# Patient Record
Sex: Male | Born: 1990 | Hispanic: Yes | Marital: Single | Smoking: Never smoker
Health system: Southern US, Community
[De-identification: ages and names within clinical notes are randomized; demographics above are authoritative.]

## PROBLEM LIST (undated history)

## (undated) ENCOUNTER — Ambulatory Visit (HOSPITAL_COMMUNITY): Payer: MEDICAID

## (undated) HISTORY — PX: APPENDECTOMY: SHX54

---

## 2017-12-06 ENCOUNTER — Emergency Department (HOSPITAL_COMMUNITY): Payer: Self-pay

## 2017-12-06 ENCOUNTER — Encounter (HOSPITAL_COMMUNITY): Payer: Self-pay | Admitting: Emergency Medicine

## 2017-12-06 ENCOUNTER — Emergency Department (HOSPITAL_COMMUNITY)
Admission: EM | Admit: 2017-12-06 | Discharge: 2017-12-06 | Disposition: A | Discharge status: Home / Self Care | Payer: Self-pay | Attending: Emergency Medicine | Admitting: Emergency Medicine

## 2017-12-06 ENCOUNTER — Other Ambulatory Visit: Payer: Self-pay

## 2017-12-06 DIAGNOSIS — Y9301 Activity, walking, marching and hiking: Secondary | ICD-10-CM | POA: Insufficient documentation

## 2017-12-06 DIAGNOSIS — Y999 Unspecified external cause status: Secondary | ICD-10-CM | POA: Insufficient documentation

## 2017-12-06 DIAGNOSIS — S2241XA Multiple fractures of ribs, right side, initial encounter for closed fracture: Secondary | ICD-10-CM | POA: Insufficient documentation

## 2017-12-06 DIAGNOSIS — Y929 Unspecified place or not applicable: Secondary | ICD-10-CM | POA: Insufficient documentation

## 2017-12-06 DIAGNOSIS — W0110XA Fall on same level from slipping, tripping and stumbling with subsequent striking against unspecified object, initial encounter: Secondary | ICD-10-CM | POA: Insufficient documentation

## 2017-12-06 LAB — COMPREHENSIVE METABOLIC PANEL
ALK PHOS: 101 U/L (ref 38–126)
ALT: 30 U/L (ref 17–63)
ANION GAP: 12 (ref 5–15)
AST: 32 U/L (ref 15–41)
Albumin: 4.1 g/dL (ref 3.5–5.0)
BUN: 13 mg/dL (ref 6–20)
CALCIUM: 9.4 mg/dL (ref 8.9–10.3)
CO2: 24 mmol/L (ref 22–32)
CREATININE: 0.81 mg/dL (ref 0.61–1.24)
Chloride: 104 mmol/L (ref 101–111)
Glucose, Bld: 137 mg/dL — ABNORMAL HIGH (ref 65–99)
Potassium: 3.7 mmol/L (ref 3.5–5.1)
Sodium: 140 mmol/L (ref 135–145)
Total Bilirubin: 0.8 mg/dL (ref 0.3–1.2)
Total Protein: 6.8 g/dL (ref 6.5–8.1)

## 2017-12-06 LAB — URINALYSIS, ROUTINE W REFLEX MICROSCOPIC
Bilirubin Urine: NEGATIVE
GLUCOSE, UA: NEGATIVE mg/dL
Hgb urine dipstick: NEGATIVE
Ketones, ur: NEGATIVE mg/dL
LEUKOCYTES UA: NEGATIVE
Nitrite: NEGATIVE
PH: 5 (ref 5.0–8.0)
Protein, ur: NEGATIVE mg/dL
Specific Gravity, Urine: 1.021 (ref 1.005–1.030)

## 2017-12-06 LAB — CBC
HEMATOCRIT: 49.8 % (ref 39.0–52.0)
Hemoglobin: 16.6 g/dL (ref 13.0–17.0)
MCH: 28.3 pg (ref 26.0–34.0)
MCHC: 33.3 g/dL (ref 30.0–36.0)
MCV: 84.8 fL (ref 78.0–100.0)
Platelets: 244 10*3/uL (ref 150–400)
RBC: 5.87 MIL/uL — ABNORMAL HIGH (ref 4.22–5.81)
RDW: 12.8 % (ref 11.5–15.5)
WBC: 9.6 10*3/uL (ref 4.0–10.5)

## 2017-12-06 LAB — LIPASE, BLOOD: Lipase: 25 U/L (ref 11–51)

## 2017-12-06 MED ORDER — ACETAMINOPHEN 500 MG PO TABS: 500.0000 mg | ORAL_TABLET | Freq: Four times a day (QID) | ORAL | 0 refills | Status: DC | PRN

## 2017-12-06 MED ORDER — IBUPROFEN 600 MG PO TABS: 600.0000 mg | ORAL_TABLET | Freq: Four times a day (QID) | ORAL | 0 refills | Status: DC | PRN

## 2017-12-06 NOTE — ED Triage Notes (Signed)
Wife/translator stated, he has stomach pain and back pasin for a week.

## 2017-12-06 NOTE — ED Triage Notes (Signed)
PT instructed on use of IS via Interp

## 2017-12-06 NOTE — Discharge Instructions (Signed)
Alternate 600 mg of ibuprofen and [phone] mg of Tylenol every 3 hours as needed for pain for the next 5 days or so. Do not exceed 4000 mg of Tylenol daily.  Take ibuprofen with food to avoid upset stomach issues.  Use the incentive spirometer 2-3 times daily to avoid development of a pneumonia.  You can apply an ice pack or heating pad to the chest wall if it is hurting.  Follow-up with a primary care physician for reevaluation of your symptoms.  Return to the emergency department immediately for any concerning signs or symptoms develop such as fevers, cough, worsening shortness of breath or chest pain.

## 2017-12-06 NOTE — ED Provider Notes (Signed)
[facility] EMERGENCY DEPARTMENT Provider Note   CSN: [id number] Arrival date & time: 12/06/17  4098     History   Chief Complaint Chief Complaint  Patient presents with  . Abdominal Pain  . Back Pain    HPI Johnathan Barnett is a 27 y.o. male with no significant past medical history presents for evaluation of acute onset, constant right lateral chest wall pain for 2 days.  He states that 2 days ago he tripped over a rock and landed on his right side.  He denies head injury or loss of consciousness.  He endorses a constant pain to the right side of the chest laterally which radiates to the back.  He endorses shortness of breath when the pain intensifies.  He denies chest pain otherwise.  He denies abdominal pain, nausea, vomiting, headache.  He has not tried anything for his symptoms.  He is a non-smoker.  No recent travel or surgeries, no hemoptysis, no fever, no prior history of DVT or PE.  He is not on testosterone placement therapy.  Patient is primarily Spanish-speaking and a translator was used throughout the encounter.   The history is provided by the patient. The history is limited by a language barrier.  A language interpreter was used.    History reviewed. No pertinent past medical history.  There are no active problems to display for this patient.   History reviewed. No pertinent surgical history.      Home Medications    Prior to Admission medications   Medication Sig Start Date End Date Taking? Authorizing Provider  acetaminophen (TYLENOL) 500 MG tablet Take 1 tablet (500 mg total) by mouth every 6 (six) hours as needed. 12/06/17   Dorion Petillo A, PA-C  ibuprofen (ADVIL,MOTRIN) 600 MG tablet Take 1 tablet (600 mg total) by mouth every 6 (six) hours as needed. 12/06/17   Jeanie Sewer, PA-C    Family History No family history on file.  Social History Social History   Tobacco Use  . Smoking status: Never Smoker  . Smokeless tobacco: Never Used    Substance Use Topics  . Alcohol use: Not Currently  . Drug use: Not Currently     Allergies   Patient has no allergy information on record.   Review of Systems Review of Systems  Constitutional: Negative for chills and fever.  Respiratory: Positive for shortness of breath. Negative for cough.   Cardiovascular: Positive for chest pain.  Gastrointestinal: Negative for abdominal pain, nausea and vomiting.  Genitourinary: Negative for hematuria.  All other systems reviewed and are negative.    Physical Exam Updated Vital Signs BP 118/66 (BP Location: Right Arm)   Pulse 65   Temp (!) 97.5 F (36.4 C) (Oral)   Resp 18   SpO2 97%   Physical Exam  Constitutional: He appears well-developed and well-nourished. No distress.  HENT:  Head: Normocephalic and atraumatic.  Eyes: Conjunctivae are normal. Right eye exhibits no discharge. Left eye exhibits no discharge.  Neck: No JVD present. No tracheal deviation present.  Cardiovascular: Normal rate, regular rhythm, normal heart sounds and intact distal pulses.  Pulmonary/Chest: Effort normal and breath sounds normal. No stridor. No respiratory distress. He has no wheezes. He has no rales.  Equal rise and fall of chest, no increased work of breathing.  Speaking in full sentences without difficulty.  Patient has focal tenderness to palpation of the right inferior lateral chest wall along the costal margin and the floating ribs.  There  is no deformity, crepitus, ecchymosis, or flail segment noted.  He is speaking in full sentences without difficulty.    Abdominal: Soft. Normal appearance and bowel sounds are normal. He exhibits no distension. There is no tenderness.  Musculoskeletal: He exhibits no edema.       Arms: No midline thoracic spine tenderness to palpation.  There is mild parathoracic muscle tenderness on the right.  No deformity, crepitus, or step-off noted.  Neurological: He is alert.  Skin: Skin is warm and dry. No erythema.   Psychiatric: He has a normal mood and affect. His behavior is normal.  Nursing note and vitals reviewed.    ED Treatments / Results  Labs (all labs ordered are listed, but only abnormal results are displayed) Labs Reviewed  COMPREHENSIVE METABOLIC PANEL - Abnormal; Notable for the following components:      Result Value   Glucose, Bld 137 (*)    All other components within normal limits  CBC - Abnormal; Notable for the following components:   RBC 5.87 (*)    All other components within normal limits  LIPASE, BLOOD  URINALYSIS, ROUTINE W REFLEX MICROSCOPIC    EKG None  Radiology Dg Ribs Unilateral W/chest Right  Result Date: 12/06/2017 CLINICAL DATA:  Chest pain following fall EXAM: RIGHT RIBS AND CHEST - 3+ VIEW COMPARISON:  None. FINDINGS: Frontal chest as well as oblique and cone-down rib images obtained. Lungs are clear. Heart size and pulmonary vascularity are normal. No adenopathy. There are incomplete fractures of the anterior right eighth and ninth ribs. No displaced rib fracture evident. No pneumothorax or pleural effusion. IMPRESSION: Subtle incomplete fractures of the anterior right eighth and ninth ribs. No displaced rib fracture evident. Lungs clear. No pneumothorax or pleural effusion. Electronically Signed   By: Bretta BangWilliam  Woodruff III M.D.   On: 12/06/2017 12:12    Procedures Procedures (including critical care time)  Medications Ordered in ED Medications - No data to display   Initial Impression / Assessment and Plan / ED Course  I have reviewed the triage vital signs and the nursing notes.  Pertinent labs & imaging results that were available during my care of the patient were reviewed by me and considered in my medical decision making (see chart for details).     Patient initially triaged as having a chief complaint of abdominal pain but upon using a translator during the encounter, it became apparent that the patient is here for right-sided chest wall pain  secondary to a fall.  He is afebrile, vital signs are stable.  He is nontoxic in appearance.  Equal rise and fall of chest, no increased work of breathing on examination.  Lungs are clear to auscultation bilaterally.  Lab work is reassuring with no leukocytosis, no anemia, no letter light of normalities.  Chest x-ray shows subtle incomplete fractures of the anterior right eighth and ninth ribs with no displacement.  No pneumothorax or pleural effusion.  Doubt PE, ACS/MI, pericarditis, myocarditis, or dissection.  Will discharge with an incentive spirometer and discussed the utility of NSAIDs and Tylenol for pain management.  Recommend follow-up with a primary care physician.  Discussed strict ED return precautions.  Patient and patient's wife verbalized understanding of and agreement with plan and patient is stable for discharge home at this time.  A translator was used throughout the encounter. Final Clinical Impressions(s) / ED Diagnoses   Final diagnoses:  Closed fracture of multiple ribs of right side, initial encounter    ED Discharge Orders  Ordered    ibuprofen (ADVIL,MOTRIN) 600 MG tablet  Every 6 hours PRN     12/06/17 1259    acetaminophen (TYLENOL) 500 MG tablet  Every 6 hours PRN     12/06/17 1259      Jeanie Sewer, PA-C 12/06/17 1323  Wynetta Fines, MD 12/07/17 [phone]

## 2019-01-06 ENCOUNTER — Emergency Department (HOSPITAL_COMMUNITY)
Admission: EM | Admit: 2019-01-06 | Discharge: 2019-01-06 | Disposition: A | Discharge status: Home / Self Care | Payer: HRSA Program | Attending: Emergency Medicine | Admitting: Emergency Medicine

## 2019-01-06 ENCOUNTER — Emergency Department (HOSPITAL_COMMUNITY): Payer: HRSA Program

## 2019-01-06 ENCOUNTER — Other Ambulatory Visit: Payer: Self-pay

## 2019-01-06 ENCOUNTER — Encounter (HOSPITAL_COMMUNITY): Payer: Self-pay

## 2019-01-06 DIAGNOSIS — U071 COVID-19: Secondary | ICD-10-CM | POA: Diagnosis not present

## 2019-01-06 DIAGNOSIS — Z20822 Contact with and (suspected) exposure to covid-19: Secondary | ICD-10-CM

## 2019-01-06 DIAGNOSIS — R509 Fever, unspecified: Secondary | ICD-10-CM | POA: Diagnosis present

## 2019-01-06 MED ORDER — IBUPROFEN 800 MG PO TABS
800.0000 mg | ORAL_TABLET | Freq: Once | ORAL | Status: AC
Start: 2019-01-06 — End: 2019-01-06
  Administered 2019-01-06: 800 mg via ORAL
  Filled 2019-01-06: qty 1

## 2019-01-06 MED ORDER — IBUPROFEN 600 MG PO TABS: 600.0000 mg | ORAL_TABLET | Freq: Four times a day (QID) | ORAL | 0 refills | Status: DC | PRN

## 2019-01-06 MED ORDER — LIDOCAINE VISCOUS HCL 2 % MT SOLN: 15.0000 mL | OROMUCOSAL | 0 refills | Status: DC | PRN

## 2019-01-06 NOTE — ED Triage Notes (Signed)
Fever  X 1 day, weakness, headache.

## 2019-01-06 NOTE — ED Notes (Signed)
ED Provider at bedside. 

## 2019-01-06 NOTE — ED Notes (Signed)
Use of interpreter for triage and assessment of patient and explanation of care.  Patient describes weakness, headache, body aches.

## 2019-01-06 NOTE — ED Provider Notes (Signed)
[facility] EMERGENCY DEPARTMENT Provider Note   CSN: [id number] Arrival date & time: 01/06/19  2440     History   Chief Complaint Chief Complaint  Patient presents with  . Fever    HPI Johnathan Barnett is a 28 y.o. male.     The history is provided by the patient. The history is limited by a language barrier. A language interpreter was used.  Fever    28 year old Hispanic speaking male presenting with fever.  Spanish interpreter was used.  Patient report since yesterday developed fevers, body aches, and his chest, headache, generalized fatigue, and dizziness.  Symptoms moderate in severity not improved with Tylenol at home.  He also endorsed throat irritation and decrease in appetite.  He denies light sensitivity, neck stiffness, shortness of breath, productive cough, nausea vomiting diarrhea dysuria or rash.  He works in [employer] and states he comes in contact with a lot of people.  He denies any specific sick contacts.  No recent travel.  States last month he was sick with the same symptom which lasted for approximately 5 days.  His chest pain is described as an intermittent sharp stabbing sensation to his left chest, not alleviated or exacerbated by position.  He does not have any significant medical history.  He is a non-smoker.  History reviewed. No pertinent past medical history.  There are no active problems to display for this patient.   History reviewed. No pertinent surgical history.      Home Medications    Prior to Admission medications   Medication Sig Start Date End Date Taking? Authorizing Provider  acetaminophen (TYLENOL) 325 MG tablet Take 650 mg by mouth every 6 (six) hours as needed for mild pain, fever or headache.   Yes [provider]    Family History History reviewed. No pertinent family history.  Social History Social History   Tobacco Use  . Smoking status: Never Smoker  . Smokeless tobacco: Never Used   Substance Use Topics  . Alcohol use: Not Currently  . Drug use: Not Currently     Allergies   Patient has no known allergies.   Review of Systems Review of Systems  Constitutional: Positive for fever.  All other systems reviewed and are negative.    Physical Exam Updated Vital Signs BP 118/81 (BP Location: Right Arm)   Pulse (!) 106   Temp (!) 102 F (38.9 C)   SpO2 96%   Physical Exam Vitals signs and nursing note reviewed.  Constitutional:      General: He is not in acute distress.    Appearance: He is well-developed. He is not ill-appearing.  HENT:     Head: Atraumatic.     Nose: Nose normal.     Mouth/Throat:     Mouth: Mucous membranes are moist.  Eyes:     Conjunctiva/sclera: Conjunctivae normal.  Neck:     Musculoskeletal: Normal range of motion and neck supple. No neck rigidity.  Cardiovascular:     Rate and Rhythm: Normal rate and regular rhythm.     Pulses: Normal pulses.     Heart sounds: Normal heart sounds.  Pulmonary:     Breath sounds: Normal breath sounds. No wheezing, rhonchi or rales.  Abdominal:     Palpations: Abdomen is soft.     Tenderness: There is no abdominal tenderness.  Musculoskeletal:        General: No tenderness.  Skin:    General: Skin is warm.  Findings: No rash.  Neurological:     Mental Status: He is alert and oriented to person, place, and time.  Psychiatric:        Mood and Affect: Mood normal.      ED Treatments / Results  Labs (all labs ordered are listed, but only abnormal results are displayed) Labs Reviewed  NOVEL CORONAVIRUS, NAA (HOSPITAL ORDER, SEND-OUT TO REF LAB)    EKG None  Radiology Dg Chest Portable 1 View  Result Date: 01/06/2019 CLINICAL DATA:  Fever and headaches for 1 day. EXAM: PORTABLE CHEST 1 VIEW COMPARISON:  December 06, 2017 FINDINGS: The heart size and mediastinal contours are within normal limits. Both lungs are clear. The visualized skeletal structures are unremarkable. IMPRESSION:  No active cardiopulmonary disease. Electronically Signed   By: Sherian ReinWei-Chen  Lin M.D.   On: 01/06/2019 09:12    Procedures Procedures (including critical care time)  Medications Ordered in ED Medications  ibuprofen (ADVIL) tablet 800 mg (800 mg Oral Given 01/06/19 0905)     Initial Impression / Assessment and Plan / ED Course  I have reviewed the triage vital signs and the nursing notes.  Pertinent labs & imaging results that were available during my care of the patient were reviewed by me and considered in my medical decision making (see chart for details).        BP 118/81 (BP Location: Right Arm)   Pulse (!) 106   Temp (!) 102 F (38.9 C)   SpO2 96%    Final Clinical Impressions(s) / ED Diagnoses   Final diagnoses:  Suspected Covid-19 Virus Infection    ED Discharge Orders    None     Johnathan Barnett was evaluated in Emergency Department on 01/06/2019 for the symptoms described in the history of present illness. He was evaluated in the context of the global COVID-19 pandemic, which necessitated consideration that the patient might be at risk for infection with the SARS-CoV-2 virus that causes COVID-19. Institutional protocols and algorithms that pertain to the evaluation of patients at risk for COVID-19 are in a state of rapid change based on information released by regulatory bodies including the CDC and federal and state organizations. These policies and algorithms were followed during the patient's care in the ED.  Hispanic speaking male here with symptoms concerning for COVID-19.  He endorsed fever, headache, body aches, pain in his chest.  He has an elevated temperature of 102 and mildly tachycardic with a heart rate of 106.  Suspect tachycardia likely secondary to his febrile state.  He does not complain of any significant shortness of breath or productive cough however given chest discomfort, will obtain chest x-ray.  Low suspicion for ACS or PE.  I also have low  suspicion for meningitis as patient presents without any nuchal rigidity, he otherwise well-appearing.  COVID-19 test has been obtained.  Ibuprofen given for pain and fever.  9:35 AM Chest x-ray unremarkable. Patient is stable for discharge.  Return precaution discussed.  Work note provided.  Recommend self quarantine for the appropriate time as instructed by CDC guidelines.   Fayrene Helperran, Gayatri Teasdale, PA-C 01/06/19 [phone]    Gerhard MunchLockwood, Robert, MD 01/10/19 1710

## 2019-01-08 LAB — NOVEL CORONAVIRUS, NAA (HOSP ORDER, SEND-OUT TO REF LAB; TAT 18-24 HRS): SARS-CoV-2, NAA: DETECTED — AB

## 2020-04-20 ENCOUNTER — Inpatient Hospital Stay (HOSPITAL_COMMUNITY): Payer: Self-pay | Admitting: Certified Registered"

## 2020-04-20 ENCOUNTER — Emergency Department (HOSPITAL_COMMUNITY): Payer: Self-pay

## 2020-04-20 ENCOUNTER — Encounter (HOSPITAL_COMMUNITY): Payer: Self-pay | Admitting: Emergency Medicine

## 2020-04-20 ENCOUNTER — Other Ambulatory Visit: Payer: Self-pay

## 2020-04-20 ENCOUNTER — Inpatient Hospital Stay (HOSPITAL_COMMUNITY)
Admission: EM | Admit: 2020-04-20 | Discharge: 2020-04-21 | DRG: 343 | Disposition: A | Discharge status: Home / Self Care | Payer: Self-pay | Attending: Physician Assistant | Admitting: Physician Assistant

## 2020-04-20 ENCOUNTER — Encounter (HOSPITAL_COMMUNITY): Admission: EM | Disposition: A | Discharge status: Home / Self Care | Payer: Self-pay | Source: Home / Self Care

## 2020-04-20 DIAGNOSIS — K3580 Unspecified acute appendicitis: Principal | ICD-10-CM | POA: Diagnosis present

## 2020-04-20 DIAGNOSIS — Z20822 Contact with and (suspected) exposure to covid-19: Secondary | ICD-10-CM | POA: Diagnosis present

## 2020-04-20 DIAGNOSIS — Z8616 Personal history of COVID-19: Secondary | ICD-10-CM

## 2020-04-20 HISTORY — PX: LAPAROSCOPIC APPENDECTOMY: SHX408

## 2020-04-20 LAB — COMPREHENSIVE METABOLIC PANEL
ALT: 65 U/L — ABNORMAL HIGH (ref 0–44)
AST: 40 U/L (ref 15–41)
Albumin: 4.3 g/dL (ref 3.5–5.0)
Alkaline Phosphatase: 103 U/L (ref 38–126)
Anion gap: 13 (ref 5–15)
BUN: 10 mg/dL (ref 6–20)
CO2: 22 mmol/L (ref 22–32)
Calcium: 9.6 mg/dL (ref 8.9–10.3)
Chloride: 102 mmol/L (ref 98–111)
Creatinine, Ser: 0.71 mg/dL (ref 0.61–1.24)
GFR, Estimated: >60 mL/min (ref >60)
Glucose, Bld: 132 mg/dL — ABNORMAL HIGH (ref 70–99)
Potassium: 3.8 mmol/L (ref 3.5–5.1)
Sodium: 137 mmol/L (ref 135–145)
Total Bilirubin: 0.9 mg/dL (ref 0.3–1.2)
Total Protein: 7.3 g/dL (ref 6.5–8.1)

## 2020-04-20 LAB — CBC
HCT: 47.2 % (ref 39.0–52.0)
Hemoglobin: 16 g/dL (ref 13.0–17.0)
MCH: 28.5 pg (ref 26.0–34.0)
MCHC: 33.9 g/dL (ref 30.0–36.0)
MCV: 84 fL (ref 80.0–100.0)
Platelets: 272 10*3/uL (ref 150–400)
RBC: 5.62 MIL/uL (ref 4.22–5.81)
RDW: 12.9 % (ref 11.5–15.5)
WBC: 19.5 10*3/uL — ABNORMAL HIGH (ref 4.0–10.5)
nRBC: 0 % (ref 0.0–0.2)

## 2020-04-20 LAB — URINALYSIS, ROUTINE W REFLEX MICROSCOPIC
Bilirubin Urine: NEGATIVE
Glucose, UA: NEGATIVE mg/dL
Hgb urine dipstick: NEGATIVE
Ketones, ur: NEGATIVE mg/dL
Leukocytes,Ua: NEGATIVE
Nitrite: NEGATIVE
Protein, ur: NEGATIVE mg/dL
Specific Gravity, Urine: 1.018 (ref 1.005–1.030)
pH: 9 — ABNORMAL HIGH (ref 5.0–8.0)

## 2020-04-20 LAB — RESPIRATORY PANEL BY RT PCR (FLU A&B, COVID)
Influenza A by PCR: NEGATIVE
Influenza B by PCR: NEGATIVE
SARS Coronavirus 2 by RT PCR: NEGATIVE

## 2020-04-20 LAB — LIPASE, BLOOD: Lipase: 24 U/L (ref 11–51)

## 2020-04-20 LAB — HIV ANTIBODY (ROUTINE TESTING W REFLEX): HIV Screen 4th Generation wRfx: NONREACTIVE

## 2020-04-20 SURGERY — APPENDECTOMY, LAPAROSCOPIC
Anesthesia: General | Site: Abdomen

## 2020-04-20 MED ORDER — ONDANSETRON HCL 4 MG/2ML IJ SOLN
4.0000 mg | Freq: Once | INTRAMUSCULAR | Status: AC
  Administered 2020-04-20: 4 mg via INTRAVENOUS
  Filled 2020-04-20: qty 2

## 2020-04-20 MED ORDER — DEXAMETHASONE SODIUM PHOSPHATE 10 MG/ML IJ SOLN
INTRAMUSCULAR | Status: AC
  Filled 2020-04-20: qty 1

## 2020-04-20 MED ORDER — KETOROLAC TROMETHAMINE 30 MG/ML IJ SOLN
30.0000 mg | Freq: Once | INTRAMUSCULAR | Status: AC | PRN
  Administered 2020-04-20: 30 mg via INTRAVENOUS

## 2020-04-20 MED ORDER — PROMETHAZINE HCL 25 MG/ML IJ SOLN: 6.2500 mg | INTRAMUSCULAR | Status: DC | PRN

## 2020-04-20 MED ORDER — SUCCINYLCHOLINE CHLORIDE 200 MG/10ML IV SOSY
PREFILLED_SYRINGE | INTRAVENOUS | Status: DC | PRN
  Administered 2020-04-20: 100 mg via INTRAVENOUS

## 2020-04-20 MED ORDER — BUPIVACAINE-EPINEPHRINE (PF) 0.25% -1:200000 IJ SOLN
INTRAMUSCULAR | Status: AC
  Filled 2020-04-20: qty 30

## 2020-04-20 MED ORDER — SODIUM CHLORIDE 0.9 % IR SOLN
Status: DC | PRN
  Administered 2020-04-20: 1000 mL

## 2020-04-20 MED ORDER — MIDAZOLAM HCL 2 MG/2ML IJ SOLN
INTRAMUSCULAR | Status: AC
  Filled 2020-04-20: qty 2

## 2020-04-20 MED ORDER — ENOXAPARIN SODIUM 40 MG/0.4ML ~~LOC~~ SOLN
40.0000 mg | SUBCUTANEOUS | Status: DC
  Filled 2020-04-20: qty 0.4

## 2020-04-20 MED ORDER — METOPROLOL TARTRATE 5 MG/5ML IV SOLN: 5.0000 mg | Freq: Four times a day (QID) | INTRAVENOUS | Status: DC | PRN

## 2020-04-20 MED ORDER — LACTATED RINGERS IV SOLN: INTRAVENOUS | Status: DC | PRN

## 2020-04-20 MED ORDER — OXYCODONE HCL 5 MG/5ML PO SOLN: 5.0000 mg | Freq: Once | ORAL | Status: DC | PRN

## 2020-04-20 MED ORDER — SODIUM CHLORIDE 0.9 % IV BOLUS
1000.0000 mL | Freq: Once | INTRAVENOUS | Status: AC
  Administered 2020-04-20: 1000 mL via INTRAVENOUS

## 2020-04-20 MED ORDER — MORPHINE SULFATE (PF) 2 MG/ML IV SOLN: 1.0000 mg | INTRAVENOUS | Status: DC | PRN

## 2020-04-20 MED ORDER — HYDROMORPHONE HCL 1 MG/ML IJ SOLN: 0.2500 mg | INTRAMUSCULAR | Status: DC | PRN

## 2020-04-20 MED ORDER — OXYCODONE HCL 5 MG PO TABS: 5.0000 mg | ORAL_TABLET | Freq: Once | ORAL | Status: DC | PRN

## 2020-04-20 MED ORDER — MORPHINE SULFATE (PF) 4 MG/ML IV SOLN
4.0000 mg | Freq: Once | INTRAVENOUS | Status: AC
  Administered 2020-04-20: 4 mg via INTRAVENOUS
  Filled 2020-04-20: qty 1

## 2020-04-20 MED ORDER — FENTANYL CITRATE (PF) 100 MCG/2ML IJ SOLN
INTRAMUSCULAR | Status: DC | PRN
  Administered 2020-04-20: 50 ug via INTRAVENOUS
  Administered 2020-04-20: 150 ug via INTRAVENOUS

## 2020-04-20 MED ORDER — SODIUM CHLORIDE 0.9 % IV SOLN: Freq: Once | INTRAVENOUS | Status: AC

## 2020-04-20 MED ORDER — LIDOCAINE 2% (20 MG/ML) 5 ML SYRINGE
INTRAMUSCULAR | Status: AC
  Filled 2020-04-20: qty 5

## 2020-04-20 MED ORDER — 0.9 % SODIUM CHLORIDE (POUR BTL) OPTIME
TOPICAL | Status: DC | PRN
  Administered 2020-04-20: 1000 mL

## 2020-04-20 MED ORDER — ONDANSETRON HCL 4 MG/2ML IJ SOLN
INTRAMUSCULAR | Status: AC
  Filled 2020-04-20: qty 2

## 2020-04-20 MED ORDER — IOHEXOL 300 MG/ML  SOLN
100.0000 mL | Freq: Once | INTRAMUSCULAR | Status: AC | PRN
  Administered 2020-04-20: 100 mL via INTRAVENOUS

## 2020-04-20 MED ORDER — ROCURONIUM BROMIDE 10 MG/ML (PF) SYRINGE
PREFILLED_SYRINGE | INTRAVENOUS | Status: DC | PRN
  Administered 2020-04-20: 30 mg via INTRAVENOUS
  Administered 2020-04-20: 10 mg via INTRAVENOUS

## 2020-04-20 MED ORDER — SIMETHICONE 80 MG PO CHEW
40.0000 mg | CHEWABLE_TABLET | Freq: Four times a day (QID) | ORAL | Status: DC | PRN
  Filled 2020-04-20: qty 1

## 2020-04-20 MED ORDER — PIPERACILLIN-TAZOBACTAM 3.375 G IVPB
3.3750 g | Freq: Three times a day (TID) | INTRAVENOUS | Status: DC
  Filled 2020-04-20: qty 50

## 2020-04-20 MED ORDER — ONDANSETRON 4 MG PO TBDP: 4.0000 mg | ORAL_TABLET | Freq: Four times a day (QID) | ORAL | Status: DC | PRN

## 2020-04-20 MED ORDER — PROPOFOL 10 MG/ML IV BOLUS
INTRAVENOUS | Status: DC | PRN
  Administered 2020-04-20: 50 mg via INTRAVENOUS
  Administered 2020-04-20: 150 mg via INTRAVENOUS

## 2020-04-20 MED ORDER — ONDANSETRON HCL 4 MG/2ML IJ SOLN: 4.0000 mg | Freq: Four times a day (QID) | INTRAMUSCULAR | Status: DC | PRN

## 2020-04-20 MED ORDER — IBUPROFEN 400 MG PO TABS: 600.0000 mg | ORAL_TABLET | Freq: Three times a day (TID) | ORAL | Status: DC | PRN

## 2020-04-20 MED ORDER — DIPHENHYDRAMINE HCL 25 MG PO CAPS: 25.0000 mg | ORAL_CAPSULE | Freq: Four times a day (QID) | ORAL | Status: DC | PRN

## 2020-04-20 MED ORDER — LIDOCAINE 2% (20 MG/ML) 5 ML SYRINGE
INTRAMUSCULAR | Status: DC | PRN
  Administered 2020-04-20: 60 mg via INTRAVENOUS

## 2020-04-20 MED ORDER — BUPIVACAINE-EPINEPHRINE 0.25% -1:200000 IJ SOLN
INTRAMUSCULAR | Status: DC | PRN
  Administered 2020-04-20: 20 mL

## 2020-04-20 MED ORDER — FENTANYL CITRATE (PF) 250 MCG/5ML IJ SOLN
INTRAMUSCULAR | Status: AC
  Filled 2020-04-20: qty 5

## 2020-04-20 MED ORDER — ROCURONIUM BROMIDE 10 MG/ML (PF) SYRINGE
PREFILLED_SYRINGE | INTRAVENOUS | Status: AC
  Filled 2020-04-20: qty 10

## 2020-04-20 MED ORDER — OXYCODONE HCL 5 MG PO TABS: 5.0000 mg | ORAL_TABLET | ORAL | Status: DC | PRN

## 2020-04-20 MED ORDER — SUCCINYLCHOLINE CHLORIDE 200 MG/10ML IV SOSY
PREFILLED_SYRINGE | INTRAVENOUS | Status: AC
  Filled 2020-04-20: qty 10

## 2020-04-20 MED ORDER — CHLORHEXIDINE GLUCONATE CLOTH 2 % EX PADS: 6.0000 | MEDICATED_PAD | Freq: Once | CUTANEOUS | Status: DC

## 2020-04-20 MED ORDER — DIPHENHYDRAMINE HCL 50 MG/ML IJ SOLN: 25.0000 mg | Freq: Four times a day (QID) | INTRAMUSCULAR | Status: DC | PRN

## 2020-04-20 MED ORDER — KETOROLAC TROMETHAMINE 30 MG/ML IJ SOLN
INTRAMUSCULAR | Status: AC
  Filled 2020-04-20: qty 1

## 2020-04-20 MED ORDER — PROPOFOL 10 MG/ML IV BOLUS
INTRAVENOUS | Status: AC
  Filled 2020-04-20: qty 40

## 2020-04-20 MED ORDER — ACETAMINOPHEN 500 MG PO TABS
1000.0000 mg | ORAL_TABLET | Freq: Four times a day (QID) | ORAL | Status: DC
  Administered 2020-04-20 – 2020-04-21 (×3): 1000 mg via ORAL
  Filled 2020-04-20 (×3): qty 2

## 2020-04-20 MED ORDER — DEXAMETHASONE SODIUM PHOSPHATE 10 MG/ML IJ SOLN
INTRAMUSCULAR | Status: DC | PRN
  Administered 2020-04-20: 4 mg via INTRAVENOUS

## 2020-04-20 MED ORDER — ONDANSETRON HCL 4 MG/2ML IJ SOLN
INTRAMUSCULAR | Status: DC | PRN
  Administered 2020-04-20: 4 mg via INTRAVENOUS

## 2020-04-20 MED ORDER — KCL IN DEXTROSE-NACL 20-5-0.45 MEQ/L-%-% IV SOLN
INTRAVENOUS | Status: DC
  Filled 2020-04-20 (×2): qty 1000

## 2020-04-20 MED ORDER — PIPERACILLIN-TAZOBACTAM 3.375 G IVPB 30 MIN
3.3750 g | Freq: Once | INTRAVENOUS | Status: AC
  Administered 2020-04-20: 3.375 g via INTRAVENOUS
  Filled 2020-04-20: qty 50

## 2020-04-20 MED ORDER — MIDAZOLAM HCL 5 MG/5ML IJ SOLN
INTRAMUSCULAR | Status: DC | PRN
  Administered 2020-04-20: 2 mg via INTRAVENOUS

## 2020-04-20 MED ORDER — SUGAMMADEX SODIUM 200 MG/2ML IV SOLN
INTRAVENOUS | Status: DC | PRN
  Administered 2020-04-20: 200 mg via INTRAVENOUS

## 2020-04-20 SURGICAL SUPPLY — 45 items
APPLIER CLIP ROT 10 11.4 M/L (STAPLE)
BLADE CLIPPER SURG (BLADE) ×3 IMPLANT
CANISTER SUCT 3000ML PPV (MISCELLANEOUS) ×3 IMPLANT
CHLORAPREP W/TINT 26 (MISCELLANEOUS) ×3 IMPLANT
CLIP APPLIE ROT 10 11.4 M/L (STAPLE) IMPLANT
COVER SURGICAL LIGHT HANDLE (MISCELLANEOUS) ×3 IMPLANT
COVER WAND RF STERILE (DRAPES) ×3 IMPLANT
CUTTER FLEX LINEAR 45M (STAPLE) ×3 IMPLANT
DERMABOND ADVANCED (GAUZE/BANDAGES/DRESSINGS) ×2
DERMABOND ADVANCED .7 DNX12 (GAUZE/BANDAGES/DRESSINGS) ×1 IMPLANT
ELECT REM PT RETURN 9FT ADLT (ELECTROSURGICAL) ×3
ELECTRODE REM PT RTRN 9FT ADLT (ELECTROSURGICAL) ×1 IMPLANT
ENDOLOOP SUT PDS II  0 18 (SUTURE)
ENDOLOOP SUT PDS II 0 18 (SUTURE) IMPLANT
GLOVE BIOGEL PI IND STRL 6 (GLOVE) ×1 IMPLANT
GLOVE BIOGEL PI INDICATOR 6 (GLOVE) ×2
GLOVE BIOGEL PI MICRO 5.5 (GLOVE) ×2
GLOVE BIOGEL PI MICRO STRL 5.5 (GLOVE) ×1 IMPLANT
GOWN STRL REUS W/ TWL LRG LVL3 (GOWN DISPOSABLE) ×3 IMPLANT
GOWN STRL REUS W/TWL LRG LVL3 (GOWN DISPOSABLE) ×6
KIT BASIN OR (CUSTOM PROCEDURE TRAY) ×3 IMPLANT
KIT TURNOVER KIT B (KITS) ×3 IMPLANT
NEEDLE BLUNT 18X1 FOR OR ONLY (NEEDLE) ×3 IMPLANT
NEEDLE HYPO 18GX1.5 BLUNT FILL (NEEDLE) ×3 IMPLANT
NS IRRIG 1000ML POUR BTL (IV SOLUTION) ×3 IMPLANT
PAD ARMBOARD 7.5X6 YLW CONV (MISCELLANEOUS) ×6 IMPLANT
POUCH SPECIMEN RETRIEVAL 10MM (ENDOMECHANICALS) ×3 IMPLANT
RELOAD 45 VASCULAR/THIN (ENDOMECHANICALS) ×3 IMPLANT
RELOAD STAPLE TA45 3.5 REG BLU (ENDOMECHANICALS) ×3 IMPLANT
SCISSORS LAP 5X35 DISP (ENDOMECHANICALS) ×3 IMPLANT
SET IRRIG TUBING LAPAROSCOPIC (IRRIGATION / IRRIGATOR) IMPLANT
SET TUBE SMOKE EVAC HIGH FLOW (TUBING) ×3 IMPLANT
SHEARS HARMONIC ACE PLUS 36CM (ENDOMECHANICALS) ×3 IMPLANT
SLEEVE ENDOPATH XCEL 5M (ENDOMECHANICALS) ×3 IMPLANT
SPECIMEN JAR SMALL (MISCELLANEOUS) ×3 IMPLANT
SUT MNCRL AB 4-0 PS2 18 (SUTURE) ×3 IMPLANT
SUT VICRYL 0 UR6 27IN ABS (SUTURE) ×3 IMPLANT
SYR 30ML LL (SYRINGE) ×3 IMPLANT
TOWEL GREEN STERILE (TOWEL DISPOSABLE) ×3 IMPLANT
TOWEL GREEN STERILE FF (TOWEL DISPOSABLE) ×3 IMPLANT
TRAY FOLEY W/BAG SLVR 14FR (SET/KITS/TRAYS/PACK) IMPLANT
TRAY LAPAROSCOPIC MC (CUSTOM PROCEDURE TRAY) ×3 IMPLANT
TROCAR XCEL BLUNT TIP 100MML (ENDOMECHANICALS) ×3 IMPLANT
TROCAR XCEL NON-BLD 5MMX100MML (ENDOMECHANICALS) ×3 IMPLANT
WATER STERILE IRR 1000ML POUR (IV SOLUTION) ×3 IMPLANT

## 2020-04-20 NOTE — Anesthesia Procedure Notes (Signed)
Procedure Name: Intubation Date/Time: 04/20/2020 8:59 PM Performed by: Moshe Salisbury, CRNA Pre-anesthesia Checklist: Patient identified, Emergency Drugs available, Suction available and Patient being monitored Patient Re-evaluated:Patient Re-evaluated prior to induction Oxygen Delivery Method: Circle System Utilized Preoxygenation: Pre-oxygenation with 100% oxygen Induction Type: IV induction Ventilation: Mask ventilation without difficulty Laryngoscope Size: Mac and 3 Grade View: Grade II Tube type: Oral Tube size: 7.5 mm Number of attempts: 1 Airway Equipment and Method: Stylet Placement Confirmation: ETT inserted through vocal cords under direct vision,  positive ETCO2 and breath sounds checked- equal and bilateral Secured at: 23 cm Tube secured with: Tape Dental Injury: Teeth and Oropharynx as per pre-operative assessment

## 2020-04-20 NOTE — Progress Notes (Signed)
Spanish interpreter [place] [phone] used via video conference.  Patient oriented X 4, updated on post op plan. Patient denies pain.  VSS, patient resting comfortably.  Will continue to monitor.

## 2020-04-20 NOTE — Anesthesia Preprocedure Evaluation (Signed)
Anesthesia Evaluation  Patient identified by MRN, date of birth, ID band Patient awake    Reviewed: Allergy & Precautions, NPO status , Patient's Chart, lab work & pertinent test results  Airway Mallampati: II  TM Distance: >3 FB Neck ROM: Full    Dental no notable dental hx.    Pulmonary neg pulmonary ROS,    Pulmonary exam normal breath sounds clear to auscultation       Cardiovascular negative cardio ROS Normal cardiovascular exam Rhythm:Regular Rate:Normal     Neuro/Psych negative neurological ROS  negative psych ROS   GI/Hepatic negative GI ROS, Neg liver ROS,   Endo/Other  negative endocrine ROS  Renal/GU negative Renal ROS     Musculoskeletal negative musculoskeletal ROS (+)   Abdominal   Peds  Hematology negative hematology ROS (+)   Anesthesia Other Findings acute appendicitis  Reproductive/Obstetrics                             Anesthesia Physical Anesthesia Plan  ASA: II and emergent  Anesthesia Plan: General   Post-op Pain Management:    Induction: Intravenous  PONV Risk Score and Plan: 2 and Ondansetron, Dexamethasone, Midazolam and Treatment may vary due to age or medical condition  Airway Management Planned: Oral ETT  Additional Equipment:   Intra-op Plan:   Post-operative Plan: Extubation in OR  Informed Consent: I have reviewed the patients History and Physical, chart, labs and discussed the procedure including the risks, benefits and alternatives for the proposed anesthesia with the patient or authorized representative who has indicated his/her understanding and acceptance.     Dental advisory given and Interpreter used for interveiw  Plan Discussed with: CRNA  Anesthesia Plan Comments:         Anesthesia Quick Evaluation

## 2020-04-20 NOTE — ED Notes (Signed)
Pt given urinal for urine collection. Pt acknowledged.

## 2020-04-20 NOTE — ED Notes (Signed)
Dr. Freida Busman, surgeon at bedside

## 2020-04-20 NOTE — Progress Notes (Signed)
I was called to bedside to discuss surgery with the patient. I spoke with him via an interpreter. He continues to have abdominal pain and would like to proceed with surgery at this time. I reviewed his CT scan demonstrating acute appendicitis. We discussed the details of the surgery. Proceed to OR tonight for laparoscopic appendectomy. NPO for OR. COVID negative as of this morning. Will plan to keep in hospital overnight postop.  Sophronia Simas, MD [facility] Surgery General, Hepatobiliary and Pancreatic Surgery 04/20/20 6:27 PM

## 2020-04-20 NOTE — Transfer of Care (Signed)
Immediate Anesthesia Transfer of Care Note  Patient: Johnathan Barnett  Procedure(s) Performed: APPENDECTOMY LAPAROSCOPIC (N/A Abdomen)  Patient Location: PACU  Anesthesia Type:General  Level of Consciousness: drowsy and patient cooperative  Airway & Oxygen Therapy: Patient Spontanous Breathing and Patient connected to nasal cannula oxygen  Post-op Assessment: Report given to RN, Post -op Vital signs reviewed and stable and Patient moving all extremities  Post vital signs: Reviewed and stable  Last Vitals:  Vitals Value Taken Time  BP    Temp    Pulse 74 04/20/20 2216  Resp 17 04/20/20 2216  SpO2 97 % 04/20/20 2216  Vitals shown include unvalidated device data.  Last Pain:  Vitals:   04/20/20 1834  TempSrc: Oral  PainSc:          Complications: No complications documented.

## 2020-04-20 NOTE — Op Note (Signed)
Date: 04/20/20  Patient: Johnathan Barnett MRN: [id number]  Preoperative Diagnosis: Acute appendicitis Postoperative Diagnosis: Same  Procedure: Laparoscopic appendectomy  Surgeon: Sophronia Simas, MD  EBL: Minimal  Anesthesia: General  Specimens: Appendix  Indications: Mr. Johnathan Barnett is a 29 yo male who presented to the ED this morning after waking up with periumbilical pain. Labs were significant for a leukocytosis and he was acutely tender in the RLQ. CT scan showed early acute appendicitis. He initially declined surgery and was treated with IV antibiotics, however this evening he decided to proceed with surgery given his persistent pain.  Findings: Very early acute appendicitis without perforation or abscess.  Procedure details: Informed consent was obtained in the preoperative area prior to the procedure. The patient was brought to the operating room and placed on the table in the supine position. General anesthesia was induced and appropriate lines and drains were placed for intraoperative monitoring. Perioperative antibiotics were administered per SCIP guidelines. The abdomen was prepped and draped in the usual sterile fashion. A pre-procedure timeout was taken verifying patient identity, surgical site and procedure to be performed.  A small infraumbilical skin incision was made and the subcutaneous tissue was spread to expose the fascia. The umbilical stalk was grasped and elevated, and the fascia was sharply incised. The peritoneal cavity was visualized and a 76mm Hasson trocar was inserted. The abdomen was inspected with no evidence of visceral or vascular injury. A suprapubic 77mm port was placed, followed by a 11mm port in the LLQ, both under direct visualization. The appendix was readily identified in the RLQ, and appeared mildly dilated and erythematous, consistent with early appendicitis. The retroperitoneal attachments were taken down with Harmonic shears. A mesenteric window was  bluntly created at the base of the appendix, and the appendix was divided at the base from the cecum using a 59mm stapler with a blue load. The mesoappendix was then divided with a white load of the stapler. The specimen was placed in an endocatch bag and removed and sent for routine pathology. The surgical site was irrigated. The staple lines were inspected and appeared in tact with no bleeding or leakage. The ports were removed and the pneumoperitoneum was evacuated. The umbilical port site fascia was closed with an 0 Vicryl suture. The skin at all port sites was closed with 4-0 monocryl subcuticular suture. Dermabond was applied.  The patient tolerated the procedure well with no apparent complications. All counts were correct x2 at the end of the procedure. The patient was extubated and taken to PACU in stable condition.  Sophronia Simas, MD 04/20/20 10:03 PM

## 2020-04-20 NOTE — Progress Notes (Signed)
Pharmacy Antibiotic Note  Johnathan Barnett is a 29 y.o. male admitted on 04/20/2020 with appendicitis.  Pharmacy has been consulted for zosyn dosing. Pt is afebrile but WBC is elevated at 19.5. SCr is WNL.   Plan: Zosyn 3.375gm IV Q8H (4 hr inf) F/u renal fxn, C&S, clinical status *Pharmacy will sign off as no further dose adjustments are anticipated.   Weight: 68 kg (150 lb)  Temp (24hrs), Avg:98.8 F (37.1 C), Min:98.8 F (37.1 C), Max:98.8 F (37.1 C)  Recent Labs  Lab 04/20/20 0834  WBC 19.5*  CREATININE 0.71    CrCl cannot be calculated (Unknown ideal weight.).    No Known Allergies  Antimicrobials this admission: Zosyn 10/21>>  Dose adjustments this admission: N/A  Microbiology results: Pending  Thank you for allowing pharmacy to be a part of this patient's care.  Johnathan Barnett, Johnathan Barnett 04/20/2020 11:06 AM

## 2020-04-20 NOTE — H&P (Addendum)
Johnathan Barnett 20-Apr-1991  [id number].    An interpreter was used for the entirety of this visit  Requesting MD: Dr. Renaye Rakersrifan Chief Complaint/Reason for Consult: RLQ abdominal pain, Acute Appendicitis  HPI: Johnathan Barnett is a 29 y.o. male with no reported past medical hx and no daily medications who presented to the [facility] ED with abdominal pain, n/v. Patient reports around 3 am he awoke with periumbilical abdominal pain. He reports his pain was moderate in severity at onset and constant. He tried tylenol for this at home without any relief. He reports associated nausea with 8 episodes of emesis. He denies fever, chills, diarrhea, or symptoms. He has never had a pain like this before. Pain has improved since IV pain medication in the ED. In the ED he was afebrile without tachycardia, tachypnea, or hypotension. WBC 19.5. CT with uncomplicated appendicitis. We were asked to see.   ROS: Review of Systems  Constitutional: Negative for chills and fever.  Gastrointestinal: Positive for abdominal pain, nausea and vomiting. Negative for constipation and diarrhea.  Genitourinary: Negative for dysuria.  Psychiatric/Behavioral: Negative for substance abuse.  All other systems reviewed and are negative.   History reviewed. No pertinent family history.  History reviewed. No pertinent past medical history.  History reviewed. No pertinent surgical history.  Social History:  reports that he has never smoked. He has never used smokeless tobacco. He reports previous alcohol use. He reports previous drug use. Patient denies tobacco or alcohol use He is a cook He lives at home with a friend He has a wife and 3 children  Allergies: No Known Allergies  (Not in a hospital admission)  Physical Exam: Blood pressure 114/69, pulse 77, temperature 98.8 F (37.1 C), temperature source Oral, resp. rate 18, weight 68 kg, SpO2 99 %. General: pleasant, WD/WN male who is laying in bed in NAD HEENT:  head is normocephalic, atraumatic.  Sclera are noninjected.  PERRL.  Ears and nose without any masses or lesions.  Mouth is pink and moist. Dentition fair Heart: regular, rate, and rhythm.  Normal s1,s2. No obvious murmurs, gallops, or rubs noted.  Palpable pedal pulses bilaterally  Lungs: CTAB, no wheezes, rhonchi, or rales noted.  Respiratory effort nonlabored Abd: Soft, ND, periumbilical and RLQ tenderness without rigidity or guarding. + McBurney's point tenderness. +BS, no masses, hernias, or organomegaly MS: no BUE/BLE edema, calves soft and nontender Skin: warm and dry with no masses, lesions, or rashes Psych: A&Ox4 with an appropriate affect Neuro: cranial nerves grossly intact, equal strength in BUE/BLE bilaterally, normal speech, though process intact  Results for orders placed or performed during the hospital encounter of 04/20/20 (from the past 48 hour(s))  Lipase, blood     Status: None   Collection Time: 04/20/20  8:34 AM  Result Value Ref Range   Lipase 24 11 - 51 U/L    Comment: Performed at [facility], [address] [place], [place] [address]  Comprehensive metabolic panel     Status: Abnormal   Collection Time: 04/20/20  8:34 AM  Result Value Ref Range   Sodium 137 135 - 145 mmol/L   Potassium 3.8 3.5 - 5.1 mmol/L   Chloride 102 98 - 111 mmol/L   CO2 22 22 - 32 mmol/L   Glucose, Bld 132 (H) 70 - 99 mg/dL    Comment: Glucose reference range applies only to samples taken after fasting for at least 8 hours.   BUN 10 6 - 20  mg/dL   Creatinine, Ser 6.27 0.61 - 1.24 mg/dL   Calcium 9.6 8.9 - 03.5 mg/dL   Total Protein 7.3 6.5 - 8.1 g/dL   Albumin 4.3 3.5 - 5.0 g/dL   AST 40 15 - 41 U/L   ALT 65 (H) 0 - 44 U/L   Alkaline Phosphatase 103 38 - 126 U/L   Total Bilirubin 0.9 0.3 - 1.2 mg/dL   GFR, Estimated >00 >93 mL/min    Comment: (NOTE) Calculated using the CKD-EPI Creatinine Equation (2021)    Anion gap 13 5 - 15    Comment: Performed at [facility], [address], [place], [place] [address]  CBC     Status: Abnormal   Collection Time: 04/20/20  8:34 AM  Result Value Ref Range   WBC 19.5 (H) 4.0 - 10.5 K/uL   RBC 5.62 4.22 - 5.81 MIL/uL   Hemoglobin 16.0 13.0 - 17.0 g/dL   HCT 93.7 39 - 52 %   MCV 84.0 80.0 - 100.0 fL   MCH 28.5 26.0 - 34.0 pg   MCHC 33.9 30.0 - 36.0 g/dL   RDW 16.9 67.8 - 93.8 %   Platelets 272 150 - 400 K/uL   nRBC 0.0 0.0 - 0.2 %    Comment: Performed at [facility], [address], [place], [place] [address]  Respiratory Panel by RT PCR (Flu A&B, Covid) - Nasopharyngeal Swab     Status: None   Collection Time: 04/20/20  9:06 AM   Specimen: Nasopharyngeal Swab  Result Value Ref Range   SARS Coronavirus 2 by RT PCR NEGATIVE NEGATIVE    Comment: (NOTE) SARS-CoV-2 target nucleic acids are NOT DETECTED.  The SARS-CoV-2 RNA is generally detectable in upper respiratoy specimens during the acute phase of infection. The lowest concentration of SARS-CoV-2 viral copies this assay can detect is 131 copies/mL. A negative result does not preclude SARS-Cov-2 infection and should not be used as the sole basis for treatment or other patient management decisions. A negative result may occur with  improper specimen collection/handling, submission of specimen other than nasopharyngeal swab, presence of viral mutation(s) within the areas targeted by this assay, and inadequate number of viral copies (<131 copies/mL). A negative result must be combined with clinical observations, patient history, and epidemiological information. The expected result is Negative.  Fact Sheet for Patients:  https://www.moore.com/  Fact Sheet for Healthcare Providers:  https://www.young.biz/  This test is no t yet approved or cleared by the [place] FDA and  has been authorized for detection and/or diagnosis of SARS-CoV-2 by FDA under an Emergency Use Authorization (EUA). This EUA will  remain  in effect (meaning this test can be used) for the duration of the COVID-19 declaration under Section 564(b)(1) of the Act, 21 U.S.C. section 360bbb-3(b)(1), unless the authorization is terminated or revoked sooner.     Influenza A by PCR NEGATIVE NEGATIVE   Influenza B by PCR NEGATIVE NEGATIVE    Comment: (NOTE) The Xpert Xpress SARS-CoV-2/FLU/RSV assay is intended as an aid in  the diagnosis of influenza from Nasopharyngeal swab specimens and  should not be used as a sole basis for treatment. Nasal washings and  aspirates are unacceptable for Xpert Xpress SARS-CoV-2/FLU/RSV  testing.  Fact Sheet for Patients: https://www.moore.com/  Fact Sheet for Healthcare Providers: https://www.young.biz/  This test is not yet approved or cleared by the [place] FDA and  has been authorized for detection and/or diagnosis of SARS-CoV-2 by  FDA  under an Emergency Use Authorization (EUA). This EUA will remain  in effect (meaning this test can be used) for the duration of the  Covid-19 declaration under Section 564(b)(1) of the Act, 21  U.S.C. section 360bbb-3(b)(1), unless the authorization is  terminated or revoked. Performed at [facility], [address] [place], [place] [address]   Urinalysis, Routine w reflex microscopic Urine, Clean Catch     Status: Abnormal   Collection Time: 04/20/20  9:49 AM  Result Value Ref Range   Color, Urine YELLOW YELLOW   APPearance CLOUDY (A) CLEAR   Specific Gravity, Urine 1.018 1.005 - 1.030   pH 9.0 (H) 5.0 - 8.0   Glucose, UA NEGATIVE NEGATIVE mg/dL   Hgb urine dipstick NEGATIVE NEGATIVE   Bilirubin Urine NEGATIVE NEGATIVE   Ketones, ur NEGATIVE NEGATIVE mg/dL   Protein, ur NEGATIVE NEGATIVE mg/dL   Nitrite NEGATIVE NEGATIVE   Leukocytes,Ua NEGATIVE NEGATIVE    Comment: Performed at [facility], [address], [place], [place] [address]   CT ABDOMEN PELVIS W CONTRAST  Result  Date: 04/20/2020 CLINICAL DATA:  Abdominal pain and vomiting EXAM: CT ABDOMEN AND PELVIS WITH CONTRAST TECHNIQUE: Multidetector CT imaging of the abdomen and pelvis was performed using the standard protocol following bolus administration of intravenous contrast. CONTRAST:  OMNIPAQUE IOHEXOL 300 MG/ML  SOLN COMPARISON:  None. FINDINGS: Lower chest: Lung bases are clear. Hepatobiliary: No focal liver lesions are appreciable. Gallbladder wall is not appreciably thickened. There is no biliary duct dilatation. Pancreas: There is no pancreatic mass or inflammatory focus. Spleen: No splenic lesions are evident. Adrenals/Urinary Tract: Adrenals bilaterally appear normal. Kidneys bilaterally show no evident mass or hydronephrosis on either side. There is no evident renal or ureteral calculus on either side. Urinary bladder is midline with wall thickness within normal limits. Stomach/Bowel: There is no appreciable bowel wall or mesenteric thickening. There are occasional sigmoid diverticula without diverticulitis. Terminal ileum appears within normal limits. No bowel obstruction. No free air or portal venous air. Vascular/Lymphatic: No abdominal aortic aneurysm. No arterial vascular lesions are evident. Major venous structures appear patent. There is no evident adenopathy in the abdomen or pelvis. Reproductive: Prostate and seminal vesicles are normal in size and configuration. Other: The appendix measures up to 11 mm in thickness. There is subtle enhancement of the appendiceal wall. There is slight soft tissue thickening immediately adjacent to the appendix. There is no periappendiceal fluid. No abscess or perforation in this area. No ascites or abscess is evident in the abdomen or pelvis. Musculoskeletal: No evident blastic or lytic bone lesions. No intramuscular or abdominal wall lesions evident. IMPRESSION: 1.  Findings indicative of a degree of acute appendicitis. Appendix: Location: Appendix arises from the cecum  inferiorly extending medially. Appendix is at the mid iliac crest level to the right of midline. Diameter: 11 mm Appendicolith: None Mucosal hyper-enhancement: Present Extraluminal gas: None Periappendiceal collection: No fluid. Mild soft tissue stranding. No evident abscess in the appendiceal region. 2. No bowel obstruction. Minimal diverticulosis. No abscess in the abdomen or pelvis. 3. No renal or ureteral calculus. No hydronephrosis. Urinary bladder wall thickness within normal limits. Critical Value/emergent results were called by telephone at the time of interpretation on 04/20/2020 at 10:46 am to provider [facility] , who verbally acknowledged these results. Electronically Signed   By: Bretta Bang III M.D.   On: 04/20/2020 10:46    Anti-infectives (From admission, onward)   Start     Dose/Rate Route  Frequency Ordered Stop   04/20/20 1800  piperacillin-tazobactam (ZOSYN) IVPB 3.375 g        3.375 g 12.5 mL/hr over 240 Minutes Intravenous Every 8 hours 04/20/20 1105     04/20/20 1115  piperacillin-tazobactam (ZOSYN) IVPB 3.375 g        3.375 g 100 mL/hr over 30 Minutes Intravenous  Once 04/20/20 1105 04/20/20 1145      Assessment/Plan Acute Appendicitis without perforation or abscess - Recommend OR for Laparoscopic Appendectomy. We discussed the indications for surgery, risks, benefits and the typical postoperative course. Patient is refusing surgery. We discussed the risks of refusing recommended care.  We discussed (but no limited to) the risk of patient clinically worsening/deteriorating that could lead to sepsis etc; requiring a larger emergency surgery, the risk of perforation and abscess that could require IR drainage, and the risk of recurrence. I repeated this multiple times and asked for the interpreter to clarify that the patient understands. The patient states he understands the risks. He is agreeable for admission for observation and antibiotics. At the end of the  conversation the patient states understanding of the plan, risks of refusing recommended care and that he does not have any questions. I discussed above with my attending who agrees with plan for admission for observation and abx to see how patient responds.   FEN - CLD, NPO at midnight  VTE - SCDs, Lovenox  ID - Zosyn Foley - None Plan: Recommend Laparoscopic Appendectomy. Patient refusing at this time. States understanding of risks and potential complications. Agreeable to admission to observation w/ abx.   Jacinto Halim, [facility] 04/20/2020, 11:57 AM Please see Amion for pager number during day hours 7:00am-4:30pm

## 2020-04-20 NOTE — ED Provider Notes (Signed)
[facility] EMERGENCY DEPARTMENT Provider Note   CSN: [id number] Arrival date & time: 04/20/20  4158     History Chief Complaint  Patient presents with  . Abdominal Pain  . Emesis    Johnathan Barnett is a 29 y.o. male who is predominantly Spanish-speaking presented emerged part with abdominal pain.  The patient ports he woke up from sleep around 3 AM this morning with umbilical abdominal pain.  It is gradually gotten worse since his onset.  The onset was gradual.  Pain is currently 9 out of 10.  He reports about 8 episodes of nonbilious vomiting.  He reports he had a regular bowel movement today.  He denies any fevers or chills.  He feels the pain is in the same location and does not radiate anywhere.  He has never had this pain before.  He reports he had Covid back in July but has since recovered.  He has not been vaccinated for Covid.  He denies any other medical problems.  He denies any history of abdominal surgeries.  He denies any drug allergies.  He does not take any medications.  He denies hematuria or testicular pain.   HPI     History reviewed. No pertinent past medical history.  Patient Active Problem List   Diagnosis Date Noted  . Acute appendicitis 04/20/2020    History reviewed. No pertinent surgical history.     History reviewed. No pertinent family history.  Social History   Tobacco Use  . Smoking status: Never Smoker  . Smokeless tobacco: Never Used  Substance Use Topics  . Alcohol use: Not Currently  . Drug use: Not Currently    Home Medications Prior to Admission medications   Medication Sig Start Date End Date Taking? Authorizing Provider  acetaminophen (TYLENOL) 325 MG tablet Take 650 mg by mouth every 6 (six) hours as needed for mild pain, fever or headache.   Yes [provider]  ibuprofen (ADVIL) 600 MG tablet Take 1 tablet (600 mg total) by mouth every 6 (six) hours as needed. Patient not taking: Reported on  04/20/2020 01/06/19   Fayrene Helper, PA-C  lidocaine (XYLOCAINE) 2 % solution Use as directed 15 mLs in the mouth or throat every 4 (four) hours as needed for mouth pain. Patient not taking: Reported on 04/20/2020 01/06/19   Fayrene Helper, PA-C    Allergies    Patient has no known allergies.  Review of Systems   Review of Systems  Constitutional: Negative for chills and fever.  HENT: Negative for ear pain and sore throat.   Eyes: Negative for pain and visual disturbance.  Respiratory: Negative for cough and shortness of breath.   Cardiovascular: Negative for chest pain and palpitations.  Gastrointestinal: Positive for abdominal pain, nausea and vomiting. Negative for blood in stool, constipation and diarrhea.  Genitourinary: Negative for dysuria, hematuria, scrotal swelling and testicular pain.  Musculoskeletal: Negative for arthralgias and myalgias.  Skin: Negative for color change, rash and wound.  Neurological: Negative for syncope and headaches.  Psychiatric/Behavioral: Negative for agitation and confusion.  All other systems reviewed and are negative.   Physical Exam Updated Vital Signs BP 120/82   Pulse 87   Temp 98.8 F (37.1 C) (Oral)   Resp 15   Wt 68 kg   SpO2 99%   Physical Exam Vitals and nursing note reviewed.  Constitutional:      Appearance: He is well-developed.  HENT:     Head: Normocephalic and atraumatic.  Eyes:     Conjunctiva/sclera: Conjunctivae normal.  Cardiovascular:     Rate and Rhythm: Normal rate and regular rhythm.     Heart sounds: No murmur heard.   Pulmonary:     Effort: Pulmonary effort is normal. No respiratory distress.     Breath sounds: Normal breath sounds.  Abdominal:     Palpations: Abdomen is soft.     Tenderness: There is abdominal tenderness in the right lower quadrant and periumbilical area. There is guarding. There is no rebound. Positive signs include McBurney's sign.     Comments: Mild guarding with RLQ ttp, no rebound   Musculoskeletal:     Cervical back: Neck supple.  Skin:    General: Skin is warm and dry.  Neurological:     General: No focal deficit present.     Mental Status: He is alert and oriented to person, place, and time.  Psychiatric:        Mood and Affect: Mood normal.        Behavior: Behavior normal.     ED Results / Procedures / Treatments   Labs (all labs ordered are listed, but only abnormal results are displayed) Labs Reviewed  COMPREHENSIVE METABOLIC PANEL - Abnormal; Notable for the following components:      Result Value   Glucose, Bld 132 (*)    ALT 65 (*)    All other components within normal limits  CBC - Abnormal; Notable for the following components:   WBC 19.5 (*)    All other components within normal limits  URINALYSIS, ROUTINE W REFLEX MICROSCOPIC - Abnormal; Notable for the following components:   APPearance CLOUDY (*)    pH 9.0 (*)    All other components within normal limits  RESPIRATORY PANEL BY RT PCR (FLU A&B, COVID)  LIPASE, BLOOD  HIV ANTIBODY (ROUTINE TESTING W REFLEX)  BASIC METABOLIC PANEL  CBC    EKG None  Radiology CT ABDOMEN PELVIS W CONTRAST  Result Date: 04/20/2020 CLINICAL DATA:  Abdominal pain and vomiting EXAM: CT ABDOMEN AND PELVIS WITH CONTRAST TECHNIQUE: Multidetector CT imaging of the abdomen and pelvis was performed using the standard protocol following bolus administration of intravenous contrast. CONTRAST:  OMNIPAQUE IOHEXOL 300 MG/ML  SOLN COMPARISON:  None. FINDINGS: Lower chest: Lung bases are clear. Hepatobiliary: No focal liver lesions are appreciable. Gallbladder wall is not appreciably thickened. There is no biliary duct dilatation. Pancreas: There is no pancreatic mass or inflammatory focus. Spleen: No splenic lesions are evident. Adrenals/Urinary Tract: Adrenals bilaterally appear normal. Kidneys bilaterally show no evident mass or hydronephrosis on either side. There is no evident renal or ureteral calculus on either  side. Urinary bladder is midline with wall thickness within normal limits. Stomach/Bowel: There is no appreciable bowel wall or mesenteric thickening. There are occasional sigmoid diverticula without diverticulitis. Terminal ileum appears within normal limits. No bowel obstruction. No free air or portal venous air. Vascular/Lymphatic: No abdominal aortic aneurysm. No arterial vascular lesions are evident. Major venous structures appear patent. There is no evident adenopathy in the abdomen or pelvis. Reproductive: Prostate and seminal vesicles are normal in size and configuration. Other: The appendix measures up to 11 mm in thickness. There is subtle enhancement of the appendiceal wall. There is slight soft tissue thickening immediately adjacent to the appendix. There is no periappendiceal fluid. No abscess or perforation in this area. No ascites or abscess is evident in the abdomen or pelvis. Musculoskeletal: No evident blastic or lytic bone lesions. No intramuscular  or abdominal wall lesions evident. IMPRESSION: 1.  Findings indicative of a degree of acute appendicitis. Appendix: Location: Appendix arises from the cecum inferiorly extending medially. Appendix is at the mid iliac crest level to the right of midline. Diameter: 11 mm Appendicolith: None Mucosal hyper-enhancement: Present Extraluminal gas: None Periappendiceal collection: No fluid. Mild soft tissue stranding. No evident abscess in the appendiceal region. 2. No bowel obstruction. Minimal diverticulosis. No abscess in the abdomen or pelvis. 3. No renal or ureteral calculus. No hydronephrosis. Urinary bladder wall thickness within normal limits. Critical Value/emergent results were called by telephone at the time of interpretation on 04/20/2020 at 10:46 am to provider [facility] , who verbally acknowledged these results. Electronically Signed   By: Bretta Bang III M.D.   On: 04/20/2020 10:46    Procedures Procedures (including critical care  time)  Medications Ordered in ED Medications  piperacillin-tazobactam (ZOSYN) IVPB 3.375 g (has no administration in time range)  enoxaparin (LOVENOX) injection 40 mg (has no administration in time range)  dextrose 5 % and 0.45 % NaCl with KCl 20 mEq/L infusion ( Intravenous New Bag/Given 04/20/20 1246)  acetaminophen (TYLENOL) tablet 1,000 mg (1,000 mg Oral Given 04/20/20 1243)  ibuprofen (ADVIL) tablet 600 mg (has no administration in time range)  oxyCODONE (Oxy IR/ROXICODONE) immediate release tablet 5-10 mg (has no administration in time range)  morphine 2 MG/ML injection 1-2 mg (has no administration in time range)  diphenhydrAMINE (BENADRYL) capsule 25 mg (has no administration in time range)    Or  diphenhydrAMINE (BENADRYL) injection 25 mg (has no administration in time range)  ondansetron (ZOFRAN-ODT) disintegrating tablet 4 mg (has no administration in time range)    Or  ondansetron (ZOFRAN) injection 4 mg (has no administration in time range)  metoprolol tartrate (LOPRESSOR) injection 5 mg (has no administration in time range)  simethicone (MYLICON) chewable tablet 40 mg (has no administration in time range)  ondansetron (ZOFRAN) injection 4 mg (4 mg Intravenous Given 04/20/20 0910)  morphine 4 MG/ML injection 4 mg (4 mg Intravenous Given 04/20/20 0908)  sodium chloride 0.9 % bolus 1,000 mL (0 mLs Intravenous Stopped 04/20/20 1554)  iohexol (OMNIPAQUE) 300 MG/ML solution 100 mL (100 mLs Intravenous Contrast Given 04/20/20 1034)  0.9 %  sodium chloride infusion (0 mLs Intravenous Stopped 04/20/20 1245)  piperacillin-tazobactam (ZOSYN) IVPB 3.375 g (0 g Intravenous Stopped 04/20/20 1237)    ED Course  I have reviewed the triage vital signs and the nursing notes.  Pertinent labs & imaging results that were available during my care of the patient were reviewed by me and considered in my medical decision making (see chart for details).  This is a 34 old male presenting to the  emergency department with abdominal pain.  This involves an extensive number of treatment options, and is a complaint that carries with it a high risk of complications and morbidity.  The differential diagnosis includes appendicitis vs food-born illness vs bowel obstruction vs kidney stone vs diverticulitis vs other  Based on his history and clinical exam, I have the greatest clinical concern for early appendicitis.  He does appear stable and does not have clinical peritonitis on exam of the abdomen.  I have a low suspicion for appendix rupture at this time.  He also does not appear septic on initial presentation.  Patient made NPO.  I ordered, reviewed, and interpreted labs, which included CMP, CBC, Lipase, and UA.  Notable for WBC 19.5. I ordered medication IV morphine, IV zofran, and  IV fluids for pain and nausea I ordered imaging studies which included CT abdomen pelvis with IV contrast I independently visualized and interpreted imaging which showed acute uncomplicated appendicitis and the monitor tracing which showed NSR I consulted general surgery and discussed lab and imaging findings  After the interventions stated above, I reevaluated the patient and found he was more comfortable with his medications.  General surgery planning to admit.   Clinical Course as of Apr 21 1715  Thu Apr 20, 2020  1102 1. Findings indicative of a degree of acute appendicitis.  Appendix: Location: Appendix arises from the cecum inferiorly extending medially. Appendix is at the mid iliac crest level to the right of midline.  Diameter: 11 mm  Appendicolith: None  Mucosal hyper-enhancement: Present  Extraluminal gas: None  Periappendiceal collection: No fluid. Mild soft tissue stranding. No evident abscess in the appendiceal region.  2. No bowel obstruction. Minimal diverticulosis. No abscess in the abdomen or pelvis.  3. No renal or ureteral calculus. No hydronephrosis. Urinary  bladder wall thickness within normal limits.  Critical Value/emergent results were called by telephone at the time of interpretation on 04/20/2020 at 10:46 am to [facility] , who verbally acknowledged these results   [MT]  1102 Paged gen surgery for consult.     [MT]  1102 IV zosyn ordered.   [MT]  1106 Spoke to Casimiro NeedleMichael general surgery PA who will come see patient   [MT]  1138 Surg at bedside   [MT]    Clinical Course User Index [MT] Keisy Strickler, Kermit BaloMatthew J, MD    Final Clinical Impression(s) / ED Diagnoses Final diagnoses:  Acute appendicitis, unspecified acute appendicitis type    Rx / DC Orders ED Discharge Orders    None       Terald Sleeperrifan, Jamyah Folk J, MD 04/20/20 [phone]

## 2020-04-20 NOTE — ED Triage Notes (Signed)
Patient arrives to ED with complaints of generalized abdominal pain and emesis starting at 3am today. Pt states he has vomited x8 times.

## 2020-04-20 NOTE — Anesthesia Postprocedure Evaluation (Signed)
Anesthesia Post Note  Patient: Johnathan Barnett  Procedure(s) Performed: APPENDECTOMY LAPAROSCOPIC (N/A Abdomen)     Patient location during evaluation: PACU Anesthesia Type: General Level of consciousness: awake and alert Pain management: pain level controlled Vital Signs Assessment: post-procedure vital signs reviewed and stable Respiratory status: spontaneous breathing, nonlabored ventilation, respiratory function stable and patient connected to nasal cannula oxygen Cardiovascular status: blood pressure returned to baseline and stable Postop Assessment: no apparent nausea or vomiting Anesthetic complications: no   No complications documented.  Last Vitals:  Vitals:   04/20/20 2245 04/20/20 2300  BP: 109/69 108/62  Pulse: 72 72  Resp: (!) 22 18  Temp:  36.7 C  SpO2: 99% 95%    Last Pain:  Vitals:   04/20/20 2245  TempSrc:   PainSc: 0-No pain                 Emberlin Verner P Loghan Subia

## 2020-04-20 NOTE — ED Notes (Signed)
CCS paged for pt wanting to now to talk to MD about getting surgery

## 2020-04-21 ENCOUNTER — Other Ambulatory Visit (HOSPITAL_COMMUNITY): Payer: Self-pay | Admitting: Physician Assistant

## 2020-04-21 ENCOUNTER — Encounter (HOSPITAL_COMMUNITY): Payer: Self-pay | Admitting: Surgery

## 2020-04-21 LAB — BASIC METABOLIC PANEL
Anion gap: 8 (ref 5–15)
BUN: 11 mg/dL (ref 6–20)
CO2: 26 mmol/L (ref 22–32)
Calcium: 9.5 mg/dL (ref 8.9–10.3)
Chloride: 103 mmol/L (ref 98–111)
Creatinine, Ser: 0.95 mg/dL (ref 0.61–1.24)
GFR, Estimated: >60 mL/min (ref >60)
Glucose, Bld: 150 mg/dL — ABNORMAL HIGH (ref 70–99)
Potassium: 4.2 mmol/L (ref 3.5–5.1)
Sodium: 137 mmol/L (ref 135–145)

## 2020-04-21 LAB — CBC
HCT: 45.7 % (ref 39.0–52.0)
Hemoglobin: 15.3 g/dL (ref 13.0–17.0)
MCH: 28 pg (ref 26.0–34.0)
MCHC: 33.5 g/dL (ref 30.0–36.0)
MCV: 83.5 fL (ref 80.0–100.0)
Platelets: 233 10*3/uL (ref 150–400)
RBC: 5.47 MIL/uL (ref 4.22–5.81)
RDW: 13 % (ref 11.5–15.5)
WBC: 13.9 10*3/uL — ABNORMAL HIGH (ref 4.0–10.5)
nRBC: 0 % (ref 0.0–0.2)

## 2020-04-21 MED ORDER — OXYCODONE HCL 5 MG PO TABS: 5.0000 mg | ORAL_TABLET | Freq: Four times a day (QID) | ORAL | 0 refills | Status: DC | PRN

## 2020-04-21 MED ORDER — IBUPROFEN 600 MG PO TABS: 600.0000 mg | ORAL_TABLET | Freq: Three times a day (TID) | ORAL | 0 refills | Status: DC | PRN

## 2020-04-21 MED FILL — IBUPROFEN 600 MG TABLET: 600 | 10 days supply | Qty: 30 | Fill #0

## 2020-04-21 MED FILL — oxyCODONE HCL 5 MG TABS: 5 | 3 days supply | Qty: 10 | Fill #0

## 2020-04-21 NOTE — Progress Notes (Signed)
RN gave pt discharge instructions with the help of spanish interpretor via phone interpretor number [phone]. Pt has two new medications coming from [facility] pt getting dressed and calling his ride waiting for his medications to get delivered before leaving

## 2020-04-21 NOTE — Discharge Summary (Signed)
[facility] Discharge Summary   Patient ID: Johnathan Barnett MRN: [id number] DOB/AGE: February 05, 1991 29 y.o.  Admit date: 04/20/2020 Discharge date: 04/21/2020  Admitting Diagnosis: Acute appendicitis  Discharge Diagnosis Patient Active Problem List   Diagnosis Date Noted  . Acute appendicitis 04/20/2020    Consultants None   Imaging: CT ABDOMEN PELVIS W CONTRAST  Result Date: 04/20/2020 CLINICAL DATA:  Abdominal pain and vomiting EXAM: CT ABDOMEN AND PELVIS WITH CONTRAST TECHNIQUE: Multidetector CT imaging of the abdomen and pelvis was performed using the standard protocol following bolus administration of intravenous contrast. CONTRAST:  OMNIPAQUE IOHEXOL 300 MG/ML  SOLN COMPARISON:  None. FINDINGS: Lower chest: Lung bases are clear. Hepatobiliary: No focal liver lesions are appreciable. Gallbladder wall is not appreciably thickened. There is no biliary duct dilatation. Pancreas: There is no pancreatic mass or inflammatory focus. Spleen: No splenic lesions are evident. Adrenals/Urinary Tract: Adrenals bilaterally appear normal. Kidneys bilaterally show no evident mass or hydronephrosis on either side. There is no evident renal or ureteral calculus on either side. Urinary bladder is midline with wall thickness within normal limits. Stomach/Bowel: There is no appreciable bowel wall or mesenteric thickening. There are occasional sigmoid diverticula without diverticulitis. Terminal ileum appears within normal limits. No bowel obstruction. No free air or portal venous air. Vascular/Lymphatic: No abdominal aortic aneurysm. No arterial vascular lesions are evident. Major venous structures appear patent. There is no evident adenopathy in the abdomen or pelvis. Reproductive: Prostate and seminal vesicles are normal in size and configuration. Other: The appendix measures up to 11 mm in thickness. There is subtle enhancement of the appendiceal wall. There is slight soft tissue  thickening immediately adjacent to the appendix. There is no periappendiceal fluid. No abscess or perforation in this area. No ascites or abscess is evident in the abdomen or pelvis. Musculoskeletal: No evident blastic or lytic bone lesions. No intramuscular or abdominal wall lesions evident. IMPRESSION: 1.  Findings indicative of a degree of acute appendicitis. Appendix: Location: Appendix arises from the cecum inferiorly extending medially. Appendix is at the mid iliac crest level to the right of midline. Diameter: 11 mm Appendicolith: None Mucosal hyper-enhancement: Present Extraluminal gas: None Periappendiceal collection: No fluid. Mild soft tissue stranding. No evident abscess in the appendiceal region. 2. No bowel obstruction. Minimal diverticulosis. No abscess in the abdomen or pelvis. 3. No renal or ureteral calculus. No hydronephrosis. Urinary bladder wall thickness within normal limits. Critical Value/emergent results were called by telephone at the time of interpretation on 04/20/2020 at 10:46 am to provider [facility] , who verbally acknowledged these results. Electronically Signed   By: Bretta Bang III M.D.   On: 04/20/2020 10:46    Procedures Dr. Freida Busman (04/20/20) - Laparoscopic Appendectomy  Hospital Course:  Patient is a 29 year old male who presented to [facility] with abdominal pain.  Workup showed acute appendicitis.  Patient was admitted and underwent procedure listed above.  Tolerated procedure well and was transferred to the floor.  Diet was advanced as tolerated.  On POD#1, the patient was voiding well, tolerating diet, ambulating well, pain well controlled, vital signs stable, incisions c/d/i and felt stable for discharge home.  Patient will follow up in our office in 3 weeks and knows to call with questions or concerns.  He will call to confirm appointment date/time.    Physical Exam: General:  Alert, NAD, pleasant, comfortable Abd:  Soft, ND, mild tenderness, incisions C/D/I    I or a member of my team have reviewed  this patient in the Controlled Substance Database.   Allergies as of 04/21/2020   No Known Allergies     Medication List    TAKE these medications   acetaminophen 325 MG tablet Commonly known as: TYLENOL Take 650 mg by mouth every 6 (six) hours as needed for mild pain, fever or headache.   ibuprofen 600 MG tablet Commonly known as: ADVIL Take 1 tablet (600 mg total) by mouth every 8 (eight) hours as needed for mild pain.   oxyCODONE 5 MG immediate release tablet Commonly known as: Oxy IR/ROXICODONE Take 1 tablet (5 mg total) by mouth every 6 (six) hours as needed for moderate pain or severe pain.         Follow-up Information     [facility]. Schedule an appointment as soon as possible for a visit.   Contact information: [address] [place] [address] [phone]       Surgery, [place]. Go on 05/09/2020.   Specialty: General Surgery Why: Cita de seguimiento programada para las 9:15 de la [place]. Llegue 30 minutos antes para registrarse. Dorna Bloom identificacin con foto y cualquier informacin del seguro. Contact information: [address] [place] [address] [phone]               Signed: Juliet Rude , [facility] 04/21/2020, 8:46 AM Please see Amion for pager number during day hours 7:00am-4:30pm

## 2020-04-21 NOTE — Discharge Instructions (Signed)
CIRUGIA LAPAROSCOPICA: INSTRUCCIONES DE POST OPERATORIO.  Revise siempre los documentos que le entreguen en el lugar donde se ha hecho la cirugia.  SI USTED NECESITA DOCUMENTOS DE INCAPACIDAD (DISABLE) O DE PERMISO FAMILAR (FAMILY LEAVE) NECESITA TRAERLOS A LA OFICINA PARA QUE SEAN PROCESADOS. NO  SE LOS DE A SU DOCTOR. 1. A su alta del hospital se le dara una receta para controlar el dolor. Tomela como ha sido recetada, si la necesita. Si no la necesita puede tomar, Acetaminofen (Tylenol) o Ibuprofen (Advil) para aliviar dolor moderado. 2. Continue tomando el resto de sus medicinas. 3. Si necesita rellenar la receta, llame a la farmacia. ellos contactan a nuestra oficina pidiendo autorizacion. Este tipo de receta no pueden ser rellenadas despues de las  5pm o durante los fines de semana. 4. Con relacion a la dieta: debe ser ligera los primeros dias despues que llege a la casa. Ejemplo: sopas y galleticas. Tome bastante liquido esos dias. 5. La mayoria de los pacientes padecen de inflamacion y cambio de coloracion de la piel alrededor de las incisiones. esto toma dias en resolver.  pnerse una bolsa de hielo en el area affectada ayuda..  6. Es comun tambien tener un poco de estrenimiento si esta tomado medicinas para el dolor. incremente la cantidad de liquidos a tomar y puede tomar (Colace) esto previene el problema. Si ya tiene estrenimiento, es decir no ha defecado en 48 horas, puede tomar un laxativo (Milk of Magnesia or Miralax) uselo como el paquete le explica. 7.  A menos que se le diga algo diferente. Remueva el bendaje a las 24-48 horas despues dela cirugia. y puede banarse en la ducha sin ningun problema. usted puede tener steri-strips (pequenas curitas transparentes en la piel puesta encima de la incision)  Estas banditas strips should be left on the skin for 7-10 days.   Si su cirujano puso pegamento encima de la incision usted puede banarse bajo la ducha en 24 horas. Este pegamento empezara a  caerse en las proximas 2-3 semanas. Si le pusieron suturas o presillas (grapos) estos seran quitados en su proxima cita en la oficina. . a. ACTIVIDADES:  Puede hacer actividad ligera.  Como caminar , subir escaleras y poco a poco irlas incrementando tanto como las tolere. Puede tener relaciones sexuales cuando sea comfortable. No carge objetos pesados o haga esfuerzos que no sean aprovados por su doctor. b. Puede manejar en cuanto no esta tomando medicamentos fuertes (narcoticos) para el dolor, pueda abrochar confortablemente el cinturon de seguridad, y pueda maniobrar y usar los pedales de su vehiculo con seguridad. c. PUEDE REGRESAR A TRABAJAR  8. Debe ver a su doctor para una cita de seguimiento en 2-3 semanas despues de la cirugia.   CUANDO LLAMAR A SU MEDICO: 1. FIEBRE mayor de  101.0 2. No produccion de orina. 3. Sangramiento continue de la herida 4. Incremento de dolor, enrojecimientio o drenaje de la herida (incision) 5. Incremento de dolor abdominal.  The clinic staff is available to answer your questions during regular business hours.  Please don't hesitate to call and ask to speak to one of the nurses for clinical concerns.  If you have a medical emergency, go to the nearest emergency room or call 911.  A surgeon from [facility] is always on call at the hospital. [address], [address], [place], [place]  [address] ? [address] [address], [place], [place]   [address] [phone] ? [phone] ? FAX [phone] Web site: [email or web address]  

## 2020-04-24 LAB — SURGICAL PATHOLOGY

## 2020-11-12 ENCOUNTER — Other Ambulatory Visit: Payer: Self-pay

## 2020-11-12 ENCOUNTER — Emergency Department (HOSPITAL_COMMUNITY)
Admission: EM | Admit: 2020-11-12 | Discharge: 2020-11-12 | Disposition: A | Discharge status: Home / Self Care | Payer: Self-pay | Attending: Emergency Medicine | Admitting: Emergency Medicine

## 2020-11-12 DIAGNOSIS — Z5321 Procedure and treatment not carried out due to patient leaving prior to being seen by health care provider: Secondary | ICD-10-CM | POA: Insufficient documentation

## 2020-11-12 DIAGNOSIS — R040 Epistaxis: Secondary | ICD-10-CM | POA: Insufficient documentation

## 2020-11-12 MED ORDER — FLUTICASONE PROPIONATE 50 MCG/ACT NA SUSP: 2.0000 | Freq: Every day | NASAL | 0 refills | Status: AC

## 2020-11-12 NOTE — ED Provider Notes (Signed)
Emergency Medicine Provider Triage Evaluation Note  Keaghan Staton , a 30 y.o. male  was evaluated in triage.  Pt complains of nosebleed  Review of Systems  Positive: Blood in back of mout Negative: No cough  Physical Exam  There were no vitals taken for this visit. Gen:   Awake, no distress   Resp:  Normal effort  MSK:   Moves extremities without difficulty  Other:    Medical Decision Making  Medically screening exam initiated at 9:57 AM.  Appropriate orders placed.  Jerren Flinchbaugh was informed that the remainder of the evaluation will be completed by another provider, this initial triage assessment does not replace that evaluation, and the importance of remaining in the ED until their evaluation is complete.     Elson Areas, [place] 11/12/20 8003    Benjiman Core, MD 11/13/20 [phone]

## 2020-11-12 NOTE — ED Triage Notes (Signed)
Pt arrives POV with complaints of epitaxies since 5/11. No bleeding noted in triage. Pt states it comes and goes. Denies cough and sickness

## 2021-04-28 IMAGING — CT CT ABD-PELV W/ CM
2 of 4 series · 15 of 46 positions shown, 17 images · IV contrast (APPLIED)
Comparison: None.

CLINICAL DATA: Abdominal pain and vomiting

EXAM:
CT ABDOMEN AND PELVIS WITH CONTRAST
TECHNIQUE: Multidetector CT imaging of the abdomen and pelvis was performed
using the standard protocol following bolus administration of
intravenous contrast.
CONTRAST:  100mL OMNIPAQUE IOHEXOL 300 MG/ML  SOLN

[Series 3: abd/ pelvis 5.0 i30f 2 · axial · 0.75mm/px · z∈[+624,+1064]mm · 12 of 98 slices shown, 14 images]
[im 5/98  soft-tissue]
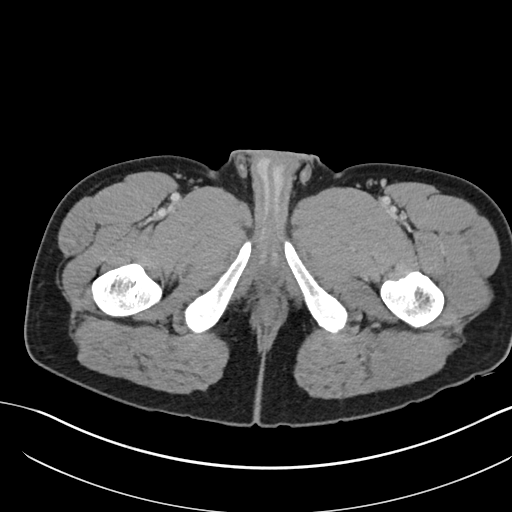
[im 5/98  bone]
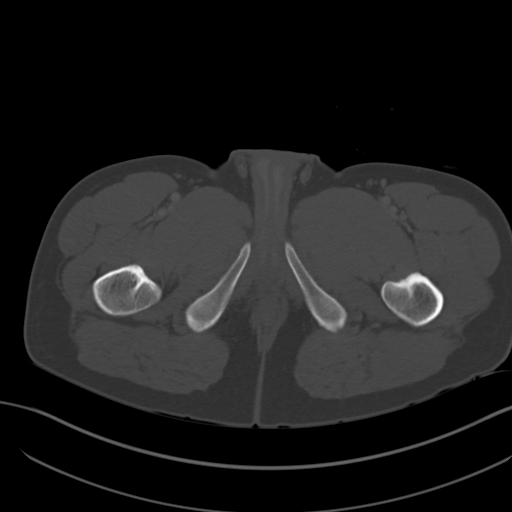
[im 13/98  soft-tissue]
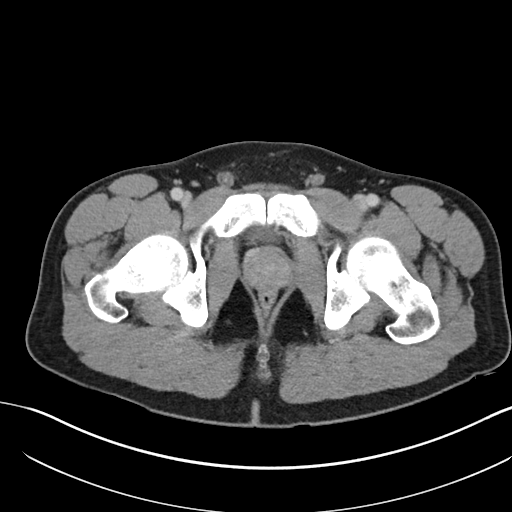
[im 21/98  soft-tissue]
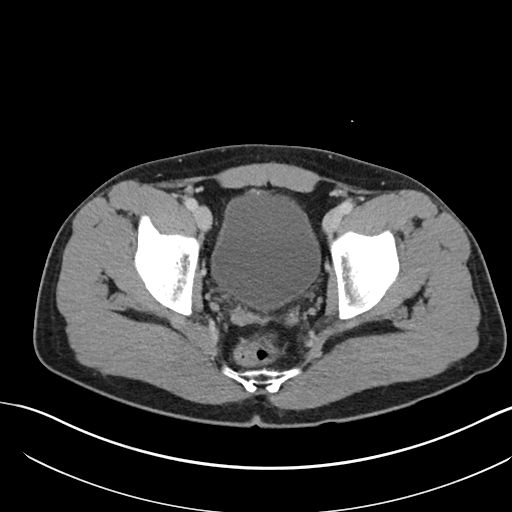
[im 29/98  soft-tissue]
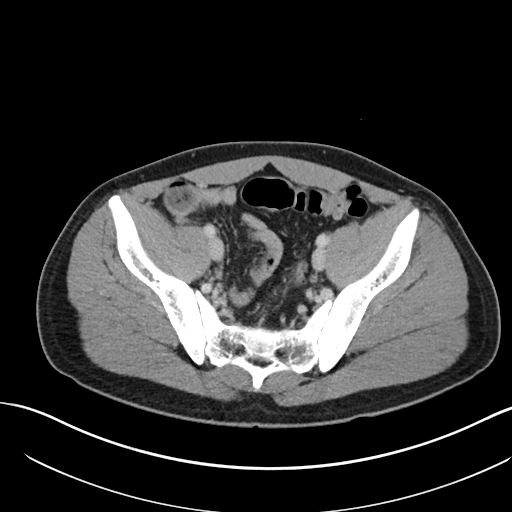
[im 37/98  soft-tissue]
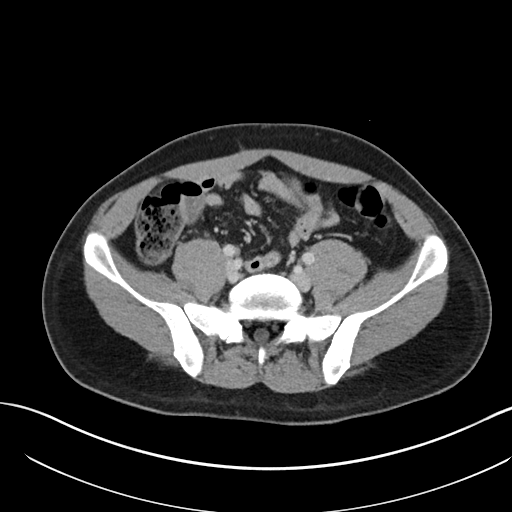
[im 45/98  soft-tissue]
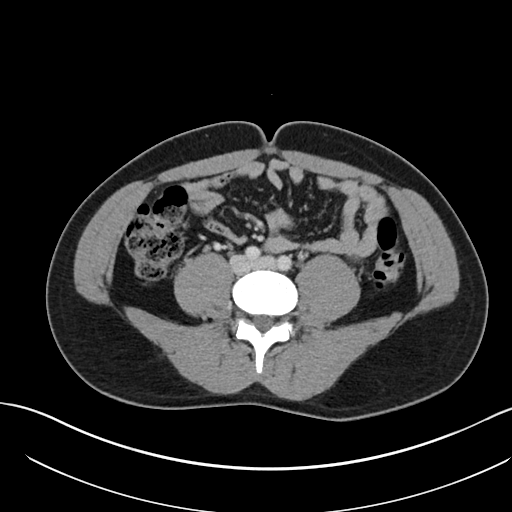
[im 53/98  soft-tissue]
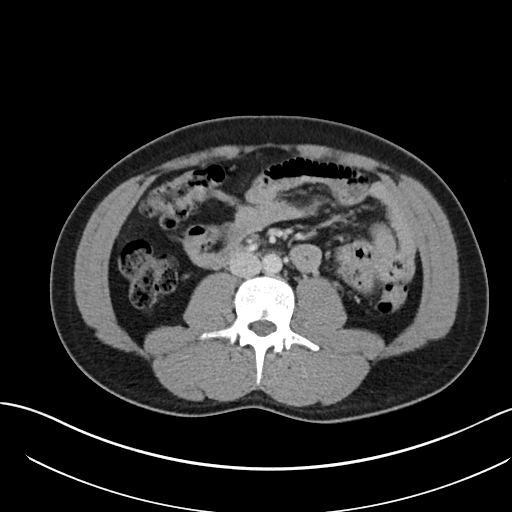
[im 61/98  soft-tissue]
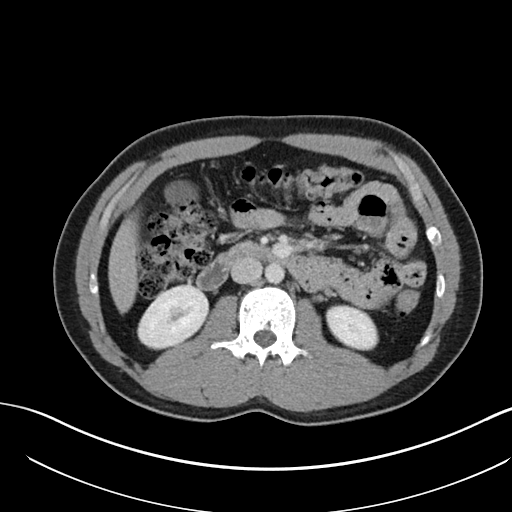
[im 69/98  soft-tissue]
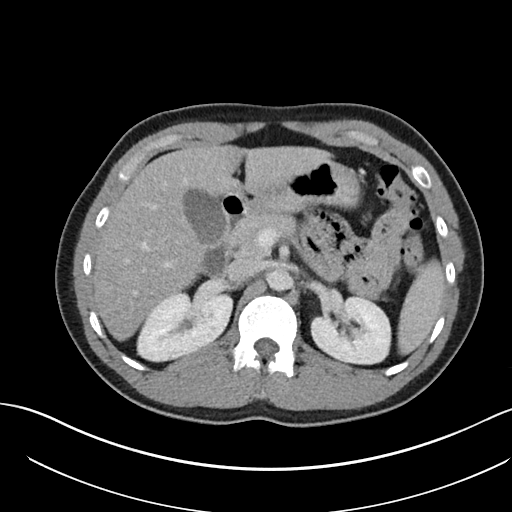
[im 69/98  bone]
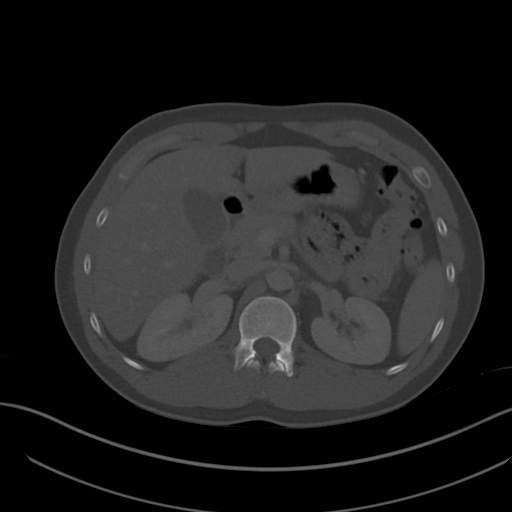
[im 77/98  soft-tissue]
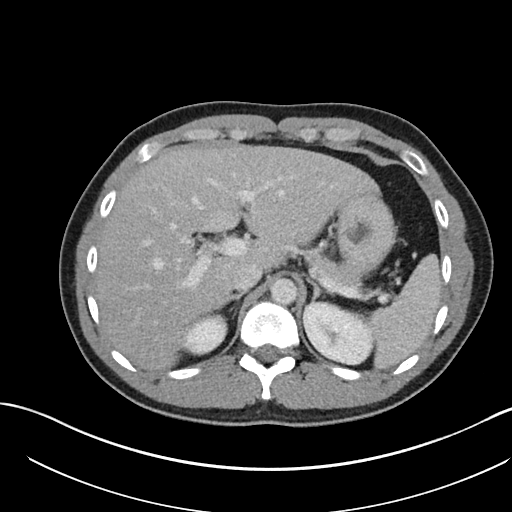
[im 85/98  soft-tissue]
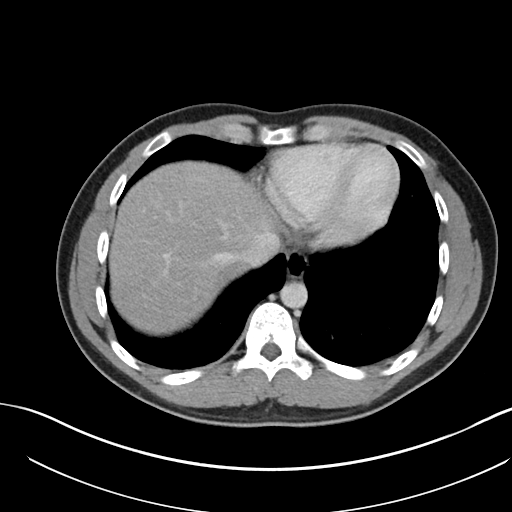
[im 93/98  soft-tissue]
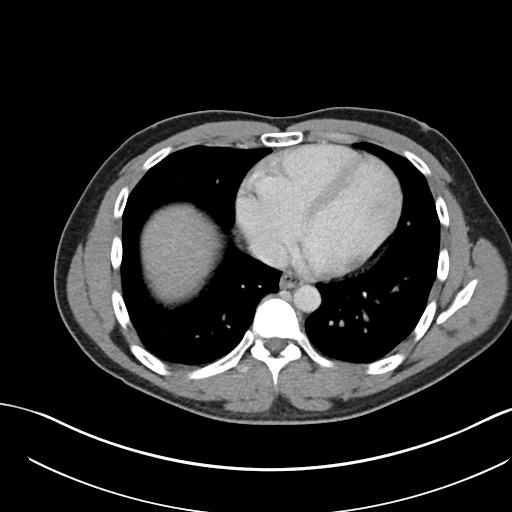

[Series 6: coronal soft tissue · coronal · 0.79mm/px · 3 of 98 slices shown]
[im 33/98  soft-tissue]
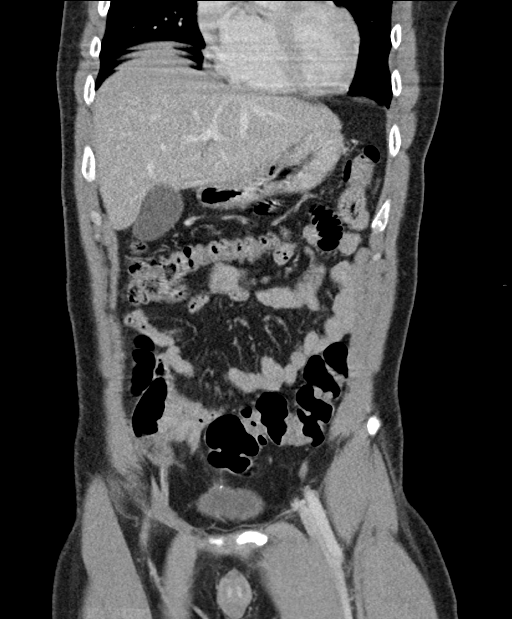
[im 44/98  soft-tissue]
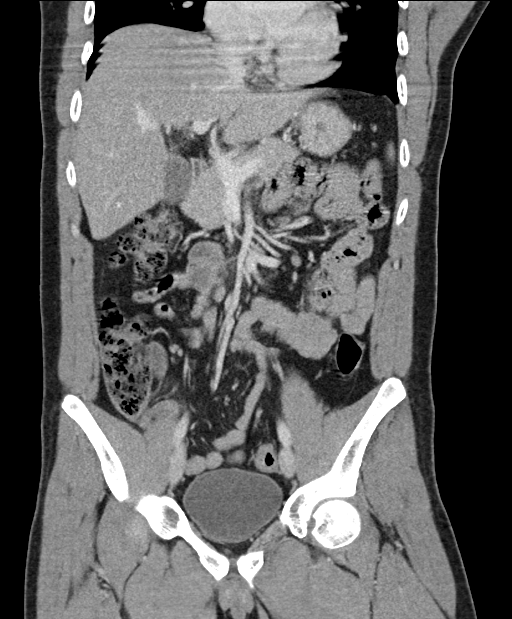
[im 54/98  soft-tissue]
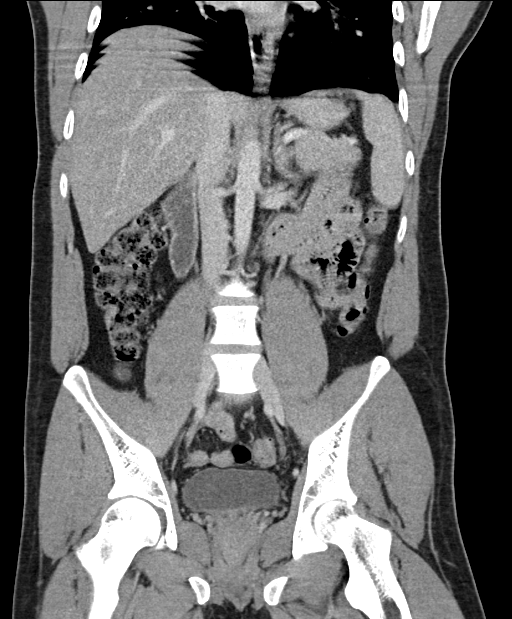

[15 of 46 positions shown; findings below may reference images not displayed]

FINDINGS: Lower chest: Lung bases are clear.

Hepatobiliary: No focal liver lesions are appreciable. Gallbladder
wall is not appreciably thickened. There is no biliary duct
dilatation.

Pancreas: There is no pancreatic mass or inflammatory focus.

Spleen: No splenic lesions are evident.

Adrenals/Urinary Tract: Adrenals bilaterally appear normal. Kidneys
bilaterally show no evident mass or hydronephrosis on either side.
There is no evident renal or ureteral calculus on either side.
Urinary bladder is midline with wall thickness within normal limits.

Stomach/Bowel: There is no appreciable bowel wall or mesenteric
thickening. There are occasional sigmoid diverticula without
diverticulitis. Terminal ileum appears within normal limits. No
bowel obstruction. No free air or portal venous air.

Vascular/Lymphatic: No abdominal aortic aneurysm. No arterial
vascular lesions are evident. Major venous structures appear patent.
There is no evident adenopathy in the abdomen or pelvis.

Reproductive: Prostate and seminal vesicles are normal in size and
configuration.

Other: The appendix measures up to 11 mm in thickness. There is
subtle enhancement of the appendiceal wall. There is slight soft
tissue thickening immediately adjacent to the appendix. There is no
periappendiceal fluid. No abscess or perforation in this area.

No ascites or abscess is evident in the abdomen or pelvis.

Musculoskeletal: No evident blastic or lytic bone lesions. No
intramuscular or abdominal wall lesions evident.
IMPRESSION: 1.  Findings indicative of a degree of acute appendicitis.

Appendix: Location: Appendix arises from the cecum inferiorly
extending medially. Appendix is at the mid iliac crest level to the
right of midline.

Diameter: 11 mm

Appendicolith: None

Mucosal hyper-enhancement: Present

Extraluminal gas: None

Periappendiceal collection: No fluid. Mild soft tissue stranding. No
evident abscess in the appendiceal region.

2. No bowel obstruction. Minimal diverticulosis. No abscess in the
abdomen or pelvis.

3. No renal or ureteral calculus. No hydronephrosis. Urinary bladder
wall thickness within normal limits.

Critical Value/emergent results were called by telephone at the time
JINETTE , who verbally acknowledged these results.

## 2022-03-02 ENCOUNTER — Other Ambulatory Visit: Payer: Self-pay

## 2022-03-02 ENCOUNTER — Emergency Department (HOSPITAL_COMMUNITY)
Admission: EM | Admit: 2022-03-02 | Discharge: 2022-03-03 | Disposition: A | Discharge status: Home / Self Care | Payer: Self-pay | Attending: Emergency Medicine | Admitting: Emergency Medicine

## 2022-03-02 ENCOUNTER — Encounter (HOSPITAL_COMMUNITY): Payer: Self-pay | Admitting: Emergency Medicine

## 2022-03-02 ENCOUNTER — Emergency Department (HOSPITAL_COMMUNITY): Payer: Self-pay

## 2022-03-02 DIAGNOSIS — Z20822 Contact with and (suspected) exposure to covid-19: Secondary | ICD-10-CM | POA: Insufficient documentation

## 2022-03-02 DIAGNOSIS — J181 Lobar pneumonia, unspecified organism: Secondary | ICD-10-CM | POA: Insufficient documentation

## 2022-03-02 DIAGNOSIS — J189 Pneumonia, unspecified organism: Secondary | ICD-10-CM

## 2022-03-02 NOTE — ED Provider Triage Note (Signed)
Emergency Medicine Provider Triage Evaluation Note  Johnathan Barnett , a 31 y.o. male  was evaluated in triage.  Pt complains of productive cough for 1 month. Reports subjective fevers as well.  Review of Systems  Positive:  Negative:   Physical Exam  There were no vitals taken for this visit. Gen:   Awake, no distress   Resp:  Normal effort  MSK:   Moves extremities without difficulty  Other:  CTAB. Speaking in full sentences.  Medical Decision Making  Medically screening exam initiated at 11:23 PM.  Appropriate orders placed.  Kenard Morawski was informed that the remainder of the evaluation will be completed by another provider, this initial triage assessment does not replace that evaluation, and the importance of remaining in the ED until their evaluation is complete.  Will order COVID and CXR.   Achille Rich, [place] 03/02/22 2327

## 2022-03-02 NOTE — ED Triage Notes (Signed)
Pt reported to ED with c/o headache, fever, coughing phlegm and hoarseness. States phlegm has been ongoing for over 1 month.

## 2022-03-03 LAB — RESP PANEL BY RT-PCR (FLU A&B, COVID) ARPGX2
Influenza A by PCR: NEGATIVE
Influenza B by PCR: NEGATIVE
SARS Coronavirus 2 by RT PCR: NEGATIVE

## 2022-03-03 MED ORDER — DOXYCYCLINE HYCLATE 100 MG PO CAPS: 100.0000 mg | ORAL_CAPSULE | Freq: Two times a day (BID) | ORAL | 0 refills | Status: AC

## 2022-03-03 MED ORDER — DOXYCYCLINE HYCLATE 100 MG PO TABS
100.0000 mg | ORAL_TABLET | Freq: Once | ORAL | Status: AC
  Administered 2022-03-03: 100 mg via ORAL
  Filled 2022-03-03: qty 1

## 2022-03-03 NOTE — Discharge Instructions (Signed)
Take the prescribed medication as directed. °Follow-up with your primary care doctor. °Return to the ED for new or worsening symptoms. °

## 2022-03-03 NOTE — ED Provider Notes (Signed)
[facility] EMERGENCY DEPARTMENT Provider Note   CSN: [id number] Arrival date & time: 03/02/22  2256     History  Chief Complaint  Patient presents with   Headache    Nolawi Kanady is a 31 y.o. male.  The history is provided by the patient and medical records.  Headache Associated symptoms: cough and fever    31 y.o. M here with productive cough and fever x1 month.  States symptoms not necessarily worsening, however not improving.  He denies any sick contacts or COVID exposures.  He has not had any night sweats, weight loss, or hemoptysis.  He has not been taking any medication for his symptoms at home.  Home Medications Prior to Admission medications   Medication Sig Start Date End Date Taking? Authorizing Provider  acetaminophen (TYLENOL) 325 MG tablet Take 650 mg by mouth every 6 (six) hours as needed for mild pain, fever or headache.    [provider]  fluticasone (FLONASE) 50 MCG/ACT nasal spray Place 2 sprays into both nostrils daily. 11/12/20   Benjiman Core, MD      Allergies    Patient has no known allergies.    Review of Systems   Review of Systems  Constitutional:  Positive for fever.  Respiratory:  Positive for cough.   All other systems reviewed and are negative.   Physical Exam Updated Vital Signs BP (!) 129/96   Pulse 74   Temp 98.3 F (36.8 C) (Oral)   Resp 17   SpO2 99%   Physical Exam Vitals and nursing note reviewed.  Constitutional:      Appearance: He is well-developed.  HENT:     Head: Normocephalic and atraumatic.  Eyes:     Conjunctiva/sclera: Conjunctivae normal.     Pupils: Pupils are equal, round, and reactive to light.  Cardiovascular:     Rate and Rhythm: Normal rate and regular rhythm.     Heart sounds: Normal heart sounds.  Pulmonary:     Effort: Pulmonary effort is normal.     Breath sounds: Normal breath sounds. No stridor. No wheezing or rhonchi.  Abdominal:     General: Bowel sounds  are normal.     Palpations: Abdomen is soft.  Musculoskeletal:        General: Normal range of motion.     Cervical back: Normal range of motion.  Skin:    General: Skin is warm and dry.  Neurological:     Mental Status: He is alert and oriented to person, place, and time.     ED Results / Procedures / Treatments   Labs (all labs ordered are listed, but only abnormal results are displayed) Labs Reviewed  RESP PANEL BY RT-PCR (FLU A&B, COVID) ARPGX2    EKG None  Radiology DG Chest 2 View  Result Date: 03/02/2022 CLINICAL DATA:  Cough.  Fever. EXAM: CHEST - 2 VIEW COMPARISON:  01/06/2019 FINDINGS: The cardiomediastinal contours are normal. Fate right perihilar opacities. Pulmonary vasculature is normal. No pleural effusion or pneumothorax. No acute osseous abnormalities are seen. IMPRESSION: Right perihilar opacities may represent pneumonia in the setting of cough and fever. Electronically Signed   By: Narda Rutherford M.D.   On: 03/02/2022 23:44    Procedures Procedures    Medications Ordered in ED Medications  doxycycline (VIBRA-TABS) tablet 100 mg (100 mg Oral Given 03/03/22 0433)    ED Course/ Medical Decision Making/ A&P  Medical Decision Making Risk Prescription drug management.   31 year old male presenting to the ED with 1 month of productive cough with yellow sputum and fever.  He is afebrile, nontoxic in appearance here.  He is in no acute respiratory distress and vitals are stable.  COVID/flu screen negative.  Chest x-ray with right perihilar opacity, likely representing pneumonia given his current symptoms.  He has not had any night sweats, weight loss, hemoptysis.  Doubt TB.  We will treat with course of doxycycline and have him follow-up with PCP for recheck.  Can return here for new concerns.  Final Clinical Impression(s) / ED Diagnoses Final diagnoses:  Community acquired pneumonia of right upper lobe of lung    Rx / DC Orders ED  Discharge Orders          Ordered    doxycycline (VIBRAMYCIN) 100 MG capsule  2 times daily        03/03/22 0446              Garlon Hatchet, PA-C 03/03/22 0534    Zadie Rhine, MD 03/03/22 [phone]

## 2022-04-03 ENCOUNTER — Emergency Department (HOSPITAL_COMMUNITY): Payer: Self-pay

## 2022-04-03 ENCOUNTER — Encounter (HOSPITAL_COMMUNITY): Payer: Self-pay | Admitting: Emergency Medicine

## 2022-04-03 ENCOUNTER — Emergency Department (HOSPITAL_COMMUNITY)
Admission: EM | Admit: 2022-04-03 | Discharge: 2022-04-04 | Disposition: A | Discharge status: Home / Self Care | Payer: Self-pay | Attending: Emergency Medicine | Admitting: Emergency Medicine

## 2022-04-03 DIAGNOSIS — Z20822 Contact with and (suspected) exposure to covid-19: Secondary | ICD-10-CM | POA: Insufficient documentation

## 2022-04-03 DIAGNOSIS — R059 Cough, unspecified: Secondary | ICD-10-CM | POA: Insufficient documentation

## 2022-04-03 DIAGNOSIS — R0981 Nasal congestion: Secondary | ICD-10-CM | POA: Insufficient documentation

## 2022-04-03 DIAGNOSIS — J069 Acute upper respiratory infection, unspecified: Secondary | ICD-10-CM

## 2022-04-03 DIAGNOSIS — M546 Pain in thoracic spine: Secondary | ICD-10-CM | POA: Insufficient documentation

## 2022-04-03 LAB — SARS CORONAVIRUS 2 BY RT PCR: SARS Coronavirus 2 by RT PCR: NEGATIVE

## 2022-04-03 NOTE — ED Triage Notes (Signed)
Patient reports persistent productive cough with chest congestion and upper back pain for several weeks . Denies fever or chills .

## 2022-04-03 NOTE — ED Provider Triage Note (Signed)
Emergency Medicine Provider Triage Evaluation Note  Johnathan Barnett , a 31 y.o. male  was evaluated in triage.  Pt complains of persistent productive cough with phlegm without any chest pain or fevers.  Patient was seen recently in the ED. On September 2 and diagnosed with community-acquired pneumonia treated with doxycycline.  States he completed entire course of antibiotics and is feeling improved since that time but continues to have some runny nose and phlegm.  No other symptoms.   Review of Systems  Positive: As above  Negative: CP, SOB  Physical Exam  BP (!) 149/106 (BP Location: Right Arm)   Pulse 65   Temp 98.3 F (36.8 C) (Oral)   Resp 15   SpO2 98%  Gen:   Awake, no distress   Resp:  Normal effort  MSK:   Moves extremities without difficulty  Other:  RRR no m/r/g. Oropharyngeal exam is unremarkable. Lungs CTAB.   Medical Decision Making  Medically screening exam initiated at 10:49 PM.  Appropriate orders placed.  Kaitlyn Skowron was informed that the remainder of the evaluation will be completed by another provider, this initial triage assessment does not replace that evaluation, and the importance of remaining in the ED until their evaluation is complete.  This chart was dictated using voice recognition software, Dragon. Despite the best efforts of this provider to proofread and correct errors, errors may still occur which can change documentation meaning.    Emeline Darling, PA-C 04/03/22 2250

## 2022-04-04 NOTE — Discharge Instructions (Signed)
You were seen in the ER today for your cough and phlegm.  Your COVID test was negative and your chest x-ray was reassuring.  You do not have any recurrent pneumonia or infection in your lungs at this time.  You likely are experiencing a viral illness which you may use over-the-counter medication such as Sudafed or Mucinex.  These will be available at any drugstore.  Increase your hydration and get plenty of rest and return to the ER with any new severe symptoms.

## 2022-04-04 NOTE — ED Provider Notes (Signed)
[facility] EMERGENCY DEPARTMENT Provider Note   CSN: [id number] Arrival date & time: 04/03/22  2234     History  Chief Complaint  Patient presents with   Cough  History of provided by the patient with the assistance of Spanish interpreter [phone].  Johnathan Barnett is a 31 y.o. male who presents with several days of productive cough with phlegm and congestion.  Some upper back pain when he coughs.  Patient was treated in September of this year for community-acquired pneumonia for which he completed the course of antibiotics at home.  No fevers at this time, endorses more irritation with a cough then any pain or difficulty breathing.  I personally read the medical record 3 saccular medical diagnoses reviewed medications daily.  HPI     Home Medications Prior to Admission medications   Medication Sig Start Date End Date Taking? Authorizing Provider  acetaminophen (TYLENOL) 325 MG tablet Take 650 mg by mouth every 6 (six) hours as needed for mild pain, fever or headache.    [provider]  doxycycline (VIBRAMYCIN) 100 MG capsule Take 1 capsule (100 mg total) by mouth 2 (two) times daily. 03/03/22   Garlon Hatchet, PA-C  fluticasone (FLONASE) 50 MCG/ACT nasal spray Place 2 sprays into both nostrils daily. 11/12/20   Benjiman Core, MD      Allergies    Patient has no known allergies.    Review of Systems   Review of Systems  Constitutional: Negative.   HENT:  Positive for congestion.   Respiratory:  Positive for cough. Negative for chest tightness.   Cardiovascular: Negative.     Physical Exam Updated Vital Signs BP 135/87 (BP Location: Right Arm)   Pulse 63   Temp 97.8 F (36.6 C) (Oral)   Resp (!) 24   SpO2 100%  Physical Exam Vitals and nursing note reviewed.  Constitutional:      Appearance: He is not ill-appearing or toxic-appearing.  HENT:     Head: Normocephalic and atraumatic.     Nose: Congestion present.     Mouth/Throat:      Mouth: Mucous membranes are moist.     Pharynx: No oropharyngeal exudate or posterior oropharyngeal erythema.  Eyes:     General:        Right eye: No discharge.        Left eye: No discharge.     Conjunctiva/sclera: Conjunctivae normal.  Cardiovascular:     Rate and Rhythm: Normal rate and regular rhythm.     Pulses: Normal pulses.     Heart sounds: Normal heart sounds. No murmur heard. Pulmonary:     Effort: Pulmonary effort is normal. No respiratory distress.     Breath sounds: Normal breath sounds. No wheezing or rales.  Abdominal:     General: Bowel sounds are normal. There is no distension.     Palpations: Abdomen is soft.     Tenderness: There is no abdominal tenderness. There is no guarding or rebound.  Musculoskeletal:        General: No deformity.     Cervical back: Neck supple.  Skin:    General: Skin is warm and dry.     Capillary Refill: Capillary refill takes less than 2 seconds.  Neurological:     General: No focal deficit present.     Mental Status: He is alert and oriented to person, place, and time. Mental status is at baseline.  Psychiatric:        Mood  and Affect: Mood normal.     ED Results / Procedures / Treatments   Labs (all labs ordered are listed, but only abnormal results are displayed) Labs Reviewed  SARS CORONAVIRUS 2 BY RT PCR    EKG None  Radiology DG Chest 2 View  Result Date: 04/03/2022 CLINICAL DATA:  Cough EXAM: CHEST - 2 VIEW COMPARISON:  Chest x-ray 03/02/2022 FINDINGS: The heart size and mediastinal contours are within normal limits. Both lungs are clear. The visualized skeletal structures are unremarkable. IMPRESSION: No active cardiopulmonary disease. Electronically Signed   By: Ronney Asters M.D.   On: 04/03/2022 23:05    Procedures Procedures    Medications Ordered in ED Medications - No data to display  ED Course/ Medical Decision Making/ A&P                           Medical Decision Making 31 year old male  presents with cough productive with phlegm and congestion.  Hypertensive on intake, vital signs otherwise normal.  Cardiopulmonary exam is normal, abdominal exam is benign.  Patient with some nasal congestion but otherwise unremarkable oropharyngeal exam.  Differential most includes but is not limited to recurrent or persistent pneumonia, viral URI, sinus infection, seasonal allergies.  Amount and/or Complexity of Data Reviewed Labs:     Details: COVID is negative Radiology: ordered.    Details: Chest x-ray negative for acute cardiopulmonary disease, no evidence of persisting pneumonia at this time.   Clinical patient most consistent with acute viral URI.  Recommend OTC medications as needed for symptom management and close outpatient follow-up.  Clinical concern for emergent underlying etiology that warrant further ED work-up or inpatient management is exceedingly low.  Tayvin  voiced understanding of his medical evaluation and treatment plan. Each of their questions answered to their expressed satisfaction.  Return precautions were given.  Patient is well-appearing, stable, and was discharged in good condition.  This chart was dictated using voice recognition software, Dragon. Despite the best efforts of this provider to proofread and correct errors, errors may still occur which can change documentation meaning.          Final Clinical Impression(s) / ED Diagnoses Final diagnoses:  None    Rx / DC Orders ED Discharge Orders     None         Joycelin Radloff, Gypsy Balsam, PA-C 04/04/22 0708    Jeanell Sparrow, DO 04/05/22 1506

## 2022-10-09 ENCOUNTER — Encounter (HOSPITAL_COMMUNITY): Payer: Self-pay

## 2022-10-09 ENCOUNTER — Other Ambulatory Visit: Payer: Self-pay

## 2022-10-09 ENCOUNTER — Emergency Department (HOSPITAL_COMMUNITY)
Admission: EM | Admit: 2022-10-09 | Discharge: 2022-10-09 | Disposition: A | Discharge status: Home / Self Care | Payer: Self-pay | Attending: Emergency Medicine | Admitting: Emergency Medicine

## 2022-10-09 DIAGNOSIS — Z20822 Contact with and (suspected) exposure to covid-19: Secondary | ICD-10-CM | POA: Insufficient documentation

## 2022-10-09 DIAGNOSIS — J020 Streptococcal pharyngitis: Secondary | ICD-10-CM | POA: Insufficient documentation

## 2022-10-09 LAB — RESP PANEL BY RT-PCR (RSV, FLU A&B, COVID)  RVPGX2
Influenza A by PCR: NEGATIVE
Influenza B by PCR: NEGATIVE
Resp Syncytial Virus by PCR: NEGATIVE
SARS Coronavirus 2 by RT PCR: NEGATIVE

## 2022-10-09 LAB — GROUP A STREP BY PCR: Group A Strep by PCR: DETECTED — AB

## 2022-10-09 MED ORDER — KETOROLAC TROMETHAMINE 60 MG/2ML IM SOLN
30.0000 mg | Freq: Once | INTRAMUSCULAR | Status: AC
  Administered 2022-10-09: 30 mg via INTRAMUSCULAR
  Filled 2022-10-09: qty 2

## 2022-10-09 MED ORDER — PENICILLIN G BENZATHINE 1200000 UNIT/2ML IM SUSY
1200000.0000 [IU] | PREFILLED_SYRINGE | Freq: Once | INTRAMUSCULAR | Status: AC
  Administered 2022-10-09: 1200000 [IU] via INTRAMUSCULAR
  Filled 2022-10-09: qty 2

## 2022-10-09 MED ORDER — DEXAMETHASONE 4 MG PO TABS
10.0000 mg | ORAL_TABLET | Freq: Once | ORAL | Status: AC
  Administered 2022-10-09: 10 mg via ORAL
  Filled 2022-10-09: qty 3

## 2022-10-09 NOTE — ED Triage Notes (Signed)
Patient arrived to ER POV with complaint of fever, sore throat & zits in arm pits. Reports symptoms started 3 days ago. Patient also reports body shaking when I  stressed situation x 5 months.   Patient did not check temp at home.

## 2022-10-10 NOTE — ED Provider Notes (Signed)
[place] EMERGENCY DEPARTMENT AT [facility] Provider Note   CSN: [id number] Arrival date & time: 10/09/22  8891     History  Chief Complaint  Patient presents with   Fever   Sore Throat    Johnathan Barnett is a 32 y.o. male.   Fever Temp source:  Subjective Severity:  Moderate Onset quality:  Gradual Duration:  2 days Associated symptoms: headaches   Associated symptoms: no chest pain   Sore Throat This is a new problem. The current episode started yesterday. The problem occurs constantly. The problem has not changed since onset.Associated symptoms include headaches. Pertinent negatives include no chest pain and no shortness of breath. Nothing aggravates the symptoms.       Home Medications Prior to Admission medications   Medication Sig Start Date End Date Taking? Authorizing Provider  acetaminophen (TYLENOL) 325 MG tablet Take 650 mg by mouth every 6 (six) hours as needed for mild pain, fever or headache.    [provider]  doxycycline (VIBRAMYCIN) 100 MG capsule Take 1 capsule (100 mg total) by mouth 2 (two) times daily. 03/03/22   Garlon Hatchet, PA-C  fluticasone (FLONASE) 50 MCG/ACT nasal spray Place 2 sprays into both nostrils daily. 11/12/20   Benjiman Core, MD      Allergies    Patient has no known allergies.    Review of Systems   Review of Systems  Constitutional:  Positive for fever.  Respiratory:  Negative for shortness of breath.   Cardiovascular:  Negative for chest pain.  Neurological:  Positive for headaches.    Physical Exam Updated Vital Signs BP 128/83   Pulse 74   Temp 97.9 F (36.6 C)   Resp 20   SpO2 100%  Physical Exam Vitals and nursing note reviewed.  Constitutional:      Appearance: He is well-developed.  HENT:     Head: Normocephalic and atraumatic.  Cardiovascular:     Rate and Rhythm: Normal rate.  Pulmonary:     Effort: Pulmonary effort is normal. No respiratory distress.  Abdominal:      General: There is no distension.  Musculoskeletal:        General: Normal range of motion.     Cervical back: Normal range of motion.  Neurological:     Mental Status: He is alert.     ED Results / Procedures / Treatments   Labs (all labs ordered are listed, but only abnormal results are displayed) Labs Reviewed  GROUP A STREP BY PCR - Abnormal; Notable for the following components:      Result Value   Group A Strep by PCR DETECTED (*)    All other components within normal limits  RESP PANEL BY RT-PCR (RSV, FLU A&B, COVID)  RVPGX2    EKG None  Radiology No results found.  Procedures Procedures    Medications Ordered in ED Medications  penicillin g benzathine (BICILLIN LA) 1200000 UNIT/2ML injection 1,200,000 Units (1,200,000 Units Intramuscular Given 10/09/22 0648)  ketorolac (TORADOL) injection 30 mg (30 mg Intramuscular Given 10/09/22 0647)  dexamethasone (DECADRON) tablet 10 mg (10 mg Oral Given 10/09/22 6945)    ED Course/ Medical Decision Making/ A&P                             Medical Decision Making Risk Prescription drug management.   Uncomplicated strep. Pcn/deca provided. Stable for d/c. Rtn precautions provided.   Final Clinical Impression(s) /  ED Diagnoses Final diagnoses:  Strep throat    Rx / DC Orders ED Discharge Orders     None         Briel Gallicchio, Barbara Cower, MD 10/10/22 [phone]
